# Patient Record
Sex: Female | Born: 1960 | Race: Black or African American | Hispanic: No | Smoking: Former smoker
Health system: Southern US, Community
[De-identification: ages and names within clinical notes are randomized; demographics above are authoritative.]

## PROBLEM LIST (undated history)

## (undated) DIAGNOSIS — E039 Hypothyroidism, unspecified: Secondary | ICD-10-CM

## (undated) DIAGNOSIS — M1990 Unspecified osteoarthritis, unspecified site: Secondary | ICD-10-CM

## (undated) DIAGNOSIS — E7800 Pure hypercholesterolemia, unspecified: Secondary | ICD-10-CM

## (undated) DIAGNOSIS — E119 Type 2 diabetes mellitus without complications: Secondary | ICD-10-CM

## (undated) DIAGNOSIS — I10 Essential (primary) hypertension: Secondary | ICD-10-CM

## (undated) HISTORY — PX: JOINT REPLACEMENT: SHX530

---

## 2017-06-08 ENCOUNTER — Other Ambulatory Visit: Payer: Self-pay | Admitting: Orthopedic Surgery

## 2017-07-23 ENCOUNTER — Inpatient Hospital Stay: Admit: 2017-07-23 | Payer: Self-pay | Admitting: Orthopedic Surgery

## 2017-07-23 SURGERY — ARTHROPLASTY, KNEE, TOTAL
Anesthesia: Spinal | Laterality: Left

## 2017-09-24 ENCOUNTER — Other Ambulatory Visit: Payer: Self-pay | Admitting: Orthopedic Surgery

## 2017-10-08 ENCOUNTER — Other Ambulatory Visit: Payer: Self-pay | Admitting: Orthopedic Surgery

## 2017-10-16 ENCOUNTER — Encounter (HOSPITAL_COMMUNITY): Payer: Self-pay

## 2017-10-16 ENCOUNTER — Encounter (HOSPITAL_COMMUNITY)
Admission: RE | Admit: 2017-10-16 | Discharge: 2017-10-16 | Disposition: A | Discharge status: Home / Self Care | Payer: Medicare HMO | Source: Ambulatory Visit | Attending: Orthopedic Surgery | Admitting: Orthopedic Surgery

## 2017-10-16 ENCOUNTER — Ambulatory Visit (HOSPITAL_COMMUNITY)
Admission: RE | Admit: 2017-10-16 | Discharge: 2017-10-16 | Disposition: A | Discharge status: Home / Self Care | Payer: Medicare HMO | Source: Ambulatory Visit | Attending: Orthopedic Surgery | Admitting: Orthopedic Surgery

## 2017-10-16 ENCOUNTER — Other Ambulatory Visit (HOSPITAL_COMMUNITY): Payer: Self-pay | Admitting: *Deleted

## 2017-10-16 ENCOUNTER — Other Ambulatory Visit: Payer: Self-pay

## 2017-10-16 DIAGNOSIS — E119 Type 2 diabetes mellitus without complications: Secondary | ICD-10-CM | POA: Insufficient documentation

## 2017-10-16 DIAGNOSIS — Z01818 Encounter for other preprocedural examination: Secondary | ICD-10-CM | POA: Diagnosis present

## 2017-10-16 HISTORY — DX: Hypothyroidism, unspecified: E03.9

## 2017-10-16 HISTORY — DX: Essential (primary) hypertension: I10

## 2017-10-16 HISTORY — DX: Unspecified osteoarthritis, unspecified site: M19.90

## 2017-10-16 LAB — SURGICAL PCR SCREEN
MRSA, PCR: NEGATIVE
Staphylococcus aureus: NEGATIVE

## 2017-10-16 LAB — URINALYSIS, ROUTINE W REFLEX MICROSCOPIC
BILIRUBIN URINE: NEGATIVE
Glucose, UA: NEGATIVE mg/dL
Hgb urine dipstick: NEGATIVE
KETONES UR: NEGATIVE mg/dL
Leukocytes, UA: NEGATIVE
NITRITE: NEGATIVE
Protein, ur: NEGATIVE mg/dL
Specific Gravity, Urine: 1.029 (ref 1.005–1.030)
pH: 5 (ref 5.0–8.0)

## 2017-10-16 LAB — PROTIME-INR
INR: 0.94
PROTHROMBIN TIME: 12.4 s (ref 11.4–15.2)

## 2017-10-16 LAB — BASIC METABOLIC PANEL
Anion gap: 10 (ref 5–15)
BUN: 22 mg/dL — AB (ref 6–20)
CO2: 24 mmol/L (ref 22–32)
CREATININE: 1.07 mg/dL — AB (ref 0.44–1.00)
Calcium: 9.9 mg/dL (ref 8.9–10.3)
Chloride: 108 mmol/L (ref 101–111)
GFR calc Af Amer: >60 mL/min (ref >60)
GFR, EST NON AFRICAN AMERICAN: 57 mL/min — AB (ref >60)
Glucose, Bld: 90 mg/dL (ref 65–99)
POTASSIUM: 3.7 mmol/L (ref 3.5–5.1)
SODIUM: 142 mmol/L (ref 135–145)

## 2017-10-16 LAB — CBC WITH DIFFERENTIAL/PLATELET
BASOS ABS: 0 10*3/uL (ref 0.0–0.1)
BASOS PCT: 0 %
EOS ABS: 0.2 10*3/uL (ref 0.0–0.7)
EOS PCT: 3 %
HCT: 38.4 % (ref 36.0–46.0)
Hemoglobin: 12.3 g/dL (ref 12.0–15.0)
LYMPHS PCT: 37 %
Lymphs Abs: 2.6 10*3/uL (ref 0.7–4.0)
MCH: 28.3 pg (ref 26.0–34.0)
MCHC: 32 g/dL (ref 30.0–36.0)
MCV: 88.3 fL (ref 78.0–100.0)
Monocytes Absolute: 0.5 10*3/uL (ref 0.1–1.0)
Monocytes Relative: 7 %
Neutro Abs: 3.7 10*3/uL (ref 1.7–7.7)
Neutrophils Relative %: 53 %
PLATELETS: 317 10*3/uL (ref 150–400)
RBC: 4.35 MIL/uL (ref 3.87–5.11)
RDW: 15.1 % (ref 11.5–15.5)
WBC: 7 10*3/uL (ref 4.0–10.5)

## 2017-10-16 LAB — HEMOGLOBIN A1C
HEMOGLOBIN A1C: 6.7 % — AB (ref 4.8–5.6)
MEAN PLASMA GLUCOSE: 145.59 mg/dL

## 2017-10-16 LAB — TYPE AND SCREEN
ABO/RH(D): AB POS
Antibody Screen: NEGATIVE

## 2017-10-16 LAB — GLUCOSE, CAPILLARY: Glucose-Capillary: 122 mg/dL — ABNORMAL HIGH (ref 65–99)

## 2017-10-16 LAB — APTT: APTT: 28 s (ref 24–36)

## 2017-10-16 NOTE — Pre-Procedure Instructions (Signed)
Nichole Schwartz  10/16/2017    Your procedure is scheduled on Monday, October 22, 2017 at 9:00 AM.   [facility] En[facility] "A" Admitting Office at 7:00 AM.   Call this number if you have problems the morning of surgery: [phone]   Questions prior to day of surgery, please call [phone] between 8 & 4 PM.   Remember:  Do not eat food or drink liquids after midnight Sunday, 10/21/17.  Take these medicines the morning of surgery with A SIP OF WATER: Levothyroxine (Synthroid) Do not take Metformin or Glimepiride morning of surgery.  Stop Fish Oil, NSAIDS (Meloxicam, Ibuprofen, Aleve, etc) and Herbal Medications as of today. Do not use Aspirin products prior to surgery.   How to Manage Your Diabetes Before Surgery   Why is it important to control my blood sugar before and after surgery?   Improving blood sugar levels before and after surgery helps healing and can limit problems.  A way of improving blood sugar control is eating a healthy diet by:  - Eating less sugar and carbohydrates  - Increasing activity/exercise  - Talk with your doctor about reaching your blood sugar goals  High blood sugars (greater than 180 mg/dL) can raise your risk of infections and slow down your recovery so you will need to focus on controlling your diabetes during the weeks before surgery.  Make sure that the doctor who takes care of your diabetes knows about your planned surgery including the date and location.  How do I manage my blood sugars before surgery?   Check your blood sugar at least 4 times a day, 2 days before surgery to make sure that they are not too high or low.  Check your blood sugar the morning of your surgery when you wake up and every 2 hours until you get to the Short-Stay unit.  Treat a low blood sugar (less than 70 mg/dL) with 1/2 cup of clear juice (cranberry or apple), 4 glucose tablets, OR glucose gel.  Recheck blood sugar in 15 minutes after  treatment (to make sure it is greater than 70 mg/dL).  If blood sugar is not greater than 70 mg/dL on re-check, call [phone] for further instructions.   Report your blood sugar to the Short-Stay nurse when you get to Short-Stay.  References:  [facility], 2007 "How to Manage your Diabetes Before and After Surgery".   Do not wear jewelry, make-up or nail polish.  Do not wear lotions, powders, perfumes or deodorant.  Do not shave 48 hours prior to surgery.    Do not bring valuables to the hospital.  [facility] is not responsible for any belongings or valuables.  Contacts, dentures or bridgework may not be worn into surgery.  Leave your suitcase in the car.  After surgery it may be brought to your room.  For patients admitted to the hospital, discharge time will be determined by your treatment team.  [facility] - Pre[facility]  Before surgery, you can play an important role.  Because skin is not sterile, your skin needs to be as free of germs as possible.  You can reduce the number of germs on you skin by washing with CHG (chlorahexidine gluconate) soap before surgery.  CHG is an antiseptic cleaner which kills germs and bonds with the skin to continue killing germs even after washing.  Please DO NOT use if you have an allergy to CHG or antibacterial soaps.  If  your skin becomes reddened/irritated stop using the CHG and inform your nurse when you arrive at Short Stay.  Do not shave (including legs and underarms) for at least 48 hours prior to the first CHG shower.  You may shave your face.  Please follow these instructions carefully:   1.  Shower with CHG Soap the night before surgery and the                    morning of Surgery.  2.  If you choose to wash your hair, wash your hair first as usual with your       normal shampoo.  3.  After you shampoo, rinse your hair and body thoroughly to remove the shampoo.  4.  Use CHG as you would any other  liquid soap.  You can apply chg directly       to the skin and wash gently with scrungie or a clean washcloth.  5.  Apply the CHG Soap to your body ONLY FROM THE NECK DOWN.        Do not use on open wounds or open sores.  Avoid contact with your eyes, ears, mouth and genitals (private parts).  Wash genitals (private parts) with your normal soap.  6.  Wash thoroughly, paying special attention to the area where your surgery        will be performed.  7.  Thoroughly rinse your body with warm water from the neck down.  8.  DO NOT shower/wash with your normal soap after using and rinsing off       the CHG Soap.  9.  Pat yourself dry with a clean towel.            10.  Wear clean pajamas.            11.  Place clean sheets on your bed the night of your first shower and do not        sleep with pets.  Day of Surgery  Shower as above. Do not apply any lotions/deodorants the morning of surgery.  Please wear clean clothes to the hospital.   Please read over the fact sheets that you were given.

## 2017-10-16 NOTE — Progress Notes (Signed)
PCP: Dr. Renaldo Harrisonaniel Addis @ [facility] in [place]  Cardiologist-none  Fasting sugars 86-144

## 2017-10-17 LAB — ABO/RH: ABO/RH(D): AB POS

## 2017-10-19 DIAGNOSIS — M1712 Unilateral primary osteoarthritis, left knee: Secondary | ICD-10-CM | POA: Diagnosis present

## 2017-10-19 MED ORDER — LACTATED RINGERS IV SOLN: INTRAVENOUS | Status: DC

## 2017-10-19 MED ORDER — TRANEXAMIC ACID 1000 MG/10ML IV SOLN
2000.0000 mg | INTRAVENOUS | Status: AC
  Administered 2017-10-22: 2000 mg via TOPICAL
  Filled 2017-10-19: qty 20

## 2017-10-19 MED ORDER — CEFAZOLIN SODIUM-DEXTROSE 2-4 GM/100ML-% IV SOLN
2.0000 g | INTRAVENOUS | Status: AC
  Administered 2017-10-22: 2 g via INTRAVENOUS
  Filled 2017-10-19: qty 100

## 2017-10-19 NOTE — H&P (Signed)
TOTAL KNEE ADMISSION H&P  Patient is being admitted for left total knee arthroplasty.  Subjective:  Chief Complaint:left knee pain.  HPI: Nichole Schwartz, 57 y.o. female, has a history of pain and functional disability in the left knee due to arthritis and has failed non-surgical conservative treatments for greater than 12 weeks to includeNSAID's and/or analgesics, corticosteriod injections, weight reduction as appropriate and activity modification.  Onset of symptoms was gradual, starting several years ago with gradually worsening course since that time. The patient noted no past surgery on the left knee(s).  Patient currently rates pain in the left knee(s) at 10 out of 10 with activity. Patient has night pain, worsening of pain with activity and weight bearing, pain that interferes with activities of daily living, pain with passive range of motion, crepitus and joint swelling.  Patient has evidence of periarticular osteophytes and joint space narrowing by imaging studies.   There is no active infection.  There are no active problems to display for this patient.  Past Medical History:  Diagnosis Date  . Arthritis   . Diabetes mellitus without complication (HCC)   . Hypertension   . Hypothyroidism     No past surgical history on file.  Current Facility-Administered Medications  Medication Dose Route Frequency Provider Last Rate Last Dose  . [START ON 10/22/2017] ceFAZolin (ANCEF) IVPB 2g/100 mL premix  2 g Intravenous To Ladonna SnideSS-Surg Rowan, Frank, MD      . Melene Muller[START ON 10/22/2017] lactated ringers infusion   Intravenous Continuous Gean Birchwoodowan, Frank, MD      . Melene Muller[START ON 10/22/2017] tranexamic acid (CYKLOKAPRON) 2,000 mg in sodium chloride 0.9 % 50 mL Topical Application  2,000 mg Topical To OR Gean Birchwoodowan, Frank, MD       Current Outpatient Medications  Medication Sig Dispense Refill Last Dose  . BIOTIN PO Take 1 tablet by mouth daily.     . ergocalciferol (VITAMIN D2) 50000 units capsule Take 50,000 Units by  mouth every Monday.     Marland Kitchen. glimepiride (AMARYL) 4 MG tablet Take 4 mg by mouth daily with breakfast.     . ibuprofen (ADVIL,MOTRIN) 800 MG tablet Take 800 mg by mouth every 12 (twelve) hours as needed for headache or moderate pain.     Marland Kitchen. levothyroxine (SYNTHROID, LEVOTHROID) 88 MCG tablet Take 88 mcg by mouth daily before breakfast.     . losartan (COZAAR) 50 MG tablet Take 50 mg by mouth daily.     . meloxicam (MOBIC) 15 MG tablet Take 15 mg by mouth daily.     . metFORMIN (GLUCOPHAGE) 500 MG tablet Take 500-1,000 mg by mouth See admin instructions. Take 500 mg by mouth in the morning and take 1000 mg by mouth in the evening     . Omega-3 Fatty Acids (FISH OIL PO) Take 3 capsules by mouth daily.     . simvastatin (ZOCOR) 40 MG tablet Take 40 mg by mouth 3 (three) times a week.      No Known Allergies  Social History   Tobacco Use  . Smoking status: Former Smoker    Last attempt to quit: 01/13/2017    Years since quitting: 0.7  . Smokeless tobacco: Never Used  . Tobacco comment: social  smoker  Substance Use Topics  . Alcohol use: Yes    Comment: social    No family history on file.   Review of Systems  Constitutional: Negative.   HENT: Positive for nosebleeds.   Eyes: Negative.   Respiratory: Negative.  Cardiovascular: Negative.   Gastrointestinal: Positive for nausea.       Poor appetite  Genitourinary:       Kidney stones  Musculoskeletal: Positive for joint pain.  Skin: Positive for rash.  Neurological: Positive for seizures.  Endo/Heme/Allergies: Bruises/bleeds easily.  Psychiatric/Behavioral: Positive for depression. The patient is nervous/anxious.     Objective:  Physical Exam  Constitutional: She is oriented to person, place, and time. She appears well-developed and well-nourished.  HENT:  Head: Normocephalic and atraumatic.  Eyes: Pupils are equal, round, and reactive to light.  Neck: Normal range of motion. Neck supple.  Cardiovascular: Intact distal pulses.   Respiratory: Effort normal.  Musculoskeletal:  Inspection of bilateral knees revealed no obvious deformity or muscle atrophy, no warmth, erythema, ecchymosis, or effusion, she does have tenderness to palpation both medial lateral joint lines of both knees, obvious crepitation is felt along the arc of range of motion, her range of motion today is from 5 to 110 on both sides, no signs of ligamentous instability, she is neurovascularly intact distally, strength 5/5 in bilateral lower extremity's, sensation 2+ bilateral lower extremities  Neurological: She is alert and oriented to person, place, and time.  Skin: Skin is warm and dry.  Psychiatric: She has a normal mood and affect. Her behavior is normal. Judgment and thought content normal.    Vital signs in last 24 hours:    Labs:   Estimated body mass index is 31.72 kg/m as calculated from the following:   Height as of 10/16/17: 5\' 6"  (1.676 m).   Weight as of 10/16/17: 89.1 kg (196 lb 8 oz).   Imaging Review Plain radiographs demonstrate  bilateral knee osteoarthritis medial compartment bone-on-bone left worse than right with periarticular osteophyte formation.  Assessment/Plan:  End stage arthritis, left knee   The patient history, physical examination, clinical judgment of the provider and imaging studies are consistent with end stage degenerative joint disease of the left knee(s) and total knee arthroplasty is deemed medically necessary. The treatment options including medical management, injection therapy arthroscopy and arthroplasty were discussed at length. The risks and benefits of total knee arthroplasty were presented and reviewed. The risks due to aseptic loosening, infection, stiffness, patella tracking problems, thromboembolic complications and other imponderables were discussed. The patient acknowledged the explanation, agreed to proceed with the plan and consent was signed. Patient is being admitted for inpatient treatment for  surgery, pain control, PT, OT, prophylactic antibiotics, VTE prophylaxis, progressive ambulation and ADL's and discharge planning. The patient is planning to be discharged home with home health services

## 2017-10-21 NOTE — Anesthesia Preprocedure Evaluation (Addendum)
Anesthesia Evaluation  Patient identified by MRN, date of birth, ID band Patient awake    Reviewed: Allergy & Precautions, NPO status , Patient's Chart, lab work & pertinent test results  Airway Mallampati: II  TM Distance: >3 FB Neck ROM: Full    Dental no notable dental hx.    Pulmonary neg pulmonary ROS, former smoker,    Pulmonary exam normal breath sounds clear to auscultation       Cardiovascular hypertension, negative cardio ROS Normal cardiovascular exam+ Valvular Problems/Murmurs  Rhythm:Regular Rate:Normal     Neuro/Psych negative neurological ROS  negative psych ROS   GI/Hepatic negative GI ROS, Neg liver ROS,   Endo/Other  negative endocrine ROSdiabetes  Renal/GU negative Renal ROS  negative genitourinary   Musculoskeletal negative musculoskeletal ROS (+)   Abdominal   Peds  Hematology negative hematology ROS (+)   Anesthesia Other Findings   Reproductive/Obstetrics                            Lab Results  Component Value Date   WBC 7.0 10/16/2017   HGB 12.3 10/16/2017   HCT 38.4 10/16/2017   MCV 88.3 10/16/2017   PLT 317 10/16/2017   Lab Results  Component Value Date   CREATININE 1.07 (H) 10/16/2017   BUN 22 (H) 10/16/2017   NA 142 10/16/2017   K 3.7 10/16/2017   CL 108 10/16/2017   CO2 24 10/16/2017    Anesthesia Physical Anesthesia Plan  ASA: III  Anesthesia Plan: Spinal   Post-op Pain Management:    Induction:   PONV Risk Score and Plan: 2 and Treatment may vary due to age or medical condition, Ondansetron and Dexamethasone  Airway Management Planned: Mask, Natural Airway and Nasal Cannula  Additional Equipment:   Intra-op Plan:   Post-operative Plan:   Informed Consent: I have reviewed the patients History and Physical, chart, labs and discussed the procedure including the risks, benefits and alternatives for the proposed anesthesia with the  patient or authorized representative who has indicated his/her understanding and acceptance.   Dental advisory given  Plan Discussed with: CRNA  Anesthesia Plan Comments:         Anesthesia Quick Evaluation

## 2017-10-22 ENCOUNTER — Inpatient Hospital Stay (HOSPITAL_COMMUNITY): Payer: Medicare HMO | Admitting: Anesthesiology

## 2017-10-22 ENCOUNTER — Encounter (HOSPITAL_COMMUNITY): Payer: Self-pay | Admitting: Certified Registered Nurse Anesthetist

## 2017-10-22 ENCOUNTER — Encounter (HOSPITAL_COMMUNITY)
Admission: RE | Disposition: A | Discharge status: Home Health | Payer: Self-pay | Source: Ambulatory Visit | Attending: Orthopedic Surgery

## 2017-10-22 ENCOUNTER — Inpatient Hospital Stay (HOSPITAL_COMMUNITY)
Admission: RE | Admit: 2017-10-22 | Discharge: 2017-10-24 | DRG: 470 | Disposition: A | Discharge status: Home Health | Payer: Medicare HMO | Source: Ambulatory Visit | Attending: Orthopedic Surgery | Admitting: Orthopedic Surgery

## 2017-10-22 DIAGNOSIS — I10 Essential (primary) hypertension: Secondary | ICD-10-CM | POA: Diagnosis present

## 2017-10-22 DIAGNOSIS — Z87891 Personal history of nicotine dependence: Secondary | ICD-10-CM

## 2017-10-22 DIAGNOSIS — E039 Hypothyroidism, unspecified: Secondary | ICD-10-CM | POA: Diagnosis present

## 2017-10-22 DIAGNOSIS — Z7984 Long term (current) use of oral hypoglycemic drugs: Secondary | ICD-10-CM

## 2017-10-22 DIAGNOSIS — Z79890 Hormone replacement therapy (postmenopausal): Secondary | ICD-10-CM | POA: Diagnosis not present

## 2017-10-22 DIAGNOSIS — Z23 Encounter for immunization: Secondary | ICD-10-CM | POA: Diagnosis present

## 2017-10-22 DIAGNOSIS — M1712 Unilateral primary osteoarthritis, left knee: Secondary | ICD-10-CM | POA: Diagnosis present

## 2017-10-22 DIAGNOSIS — Z79899 Other long term (current) drug therapy: Secondary | ICD-10-CM | POA: Diagnosis not present

## 2017-10-22 DIAGNOSIS — E119 Type 2 diabetes mellitus without complications: Secondary | ICD-10-CM | POA: Diagnosis present

## 2017-10-22 DIAGNOSIS — D62 Acute posthemorrhagic anemia: Secondary | ICD-10-CM | POA: Diagnosis not present

## 2017-10-22 HISTORY — PX: TOTAL KNEE ARTHROPLASTY: SHX125

## 2017-10-22 LAB — GLUCOSE, CAPILLARY
Glucose-Capillary: 159 mg/dL — ABNORMAL HIGH (ref 65–99)
Glucose-Capillary: 129 mg/dL — ABNORMAL HIGH (ref 65–99)

## 2017-10-22 SURGERY — ARTHROPLASTY, KNEE, TOTAL
Anesthesia: Spinal | Site: Knee | Laterality: Left

## 2017-10-22 MED ORDER — EPHEDRINE 5 MG/ML INJ
INTRAVENOUS | Status: AC
  Filled 2017-10-22: qty 10

## 2017-10-22 MED ORDER — PHENOL 1.4 % MT LIQD: 1.0000 | OROMUCOSAL | Status: DC | PRN

## 2017-10-22 MED ORDER — LACTATED RINGERS IV SOLN
INTRAVENOUS | Status: DC
Start: 2017-10-22 — End: 2017-10-22
  Administered 2017-10-22: 50 mL/h via INTRAVENOUS

## 2017-10-22 MED ORDER — BUPIVACAINE-EPINEPHRINE 0.25% -1:200000 IJ SOLN
INTRAMUSCULAR | Status: AC
  Filled 2017-10-22: qty 1

## 2017-10-22 MED ORDER — MIDAZOLAM HCL 5 MG/5ML IJ SOLN
INTRAMUSCULAR | Status: DC | PRN
  Administered 2017-10-22 (×2): 1 mg via INTRAVENOUS

## 2017-10-22 MED ORDER — ROPIVACAINE HCL 7.5 MG/ML IJ SOLN
INTRAMUSCULAR | Status: DC | PRN
  Administered 2017-10-22: 20 mL via PERINEURAL

## 2017-10-22 MED ORDER — 0.9 % SODIUM CHLORIDE (POUR BTL) OPTIME
TOPICAL | Status: DC | PRN
Start: 2017-10-22 — End: 2017-10-22
  Administered 2017-10-22: 1000 mL

## 2017-10-22 MED ORDER — FENTANYL CITRATE (PF) 250 MCG/5ML IJ SOLN
INTRAMUSCULAR | Status: AC
  Filled 2017-10-22: qty 5

## 2017-10-22 MED ORDER — DEXAMETHASONE SODIUM PHOSPHATE 10 MG/ML IJ SOLN
INTRAMUSCULAR | Status: DC | PRN
Start: 2017-10-22 — End: 2017-10-22
  Administered 2017-10-22: 5 mg via INTRAVENOUS

## 2017-10-22 MED ORDER — PROPOFOL 10 MG/ML IV BOLUS
INTRAVENOUS | Status: DC | PRN
  Administered 2017-10-22: 20 mg via INTRAVENOUS

## 2017-10-22 MED ORDER — MIDAZOLAM HCL 2 MG/2ML IJ SOLN
INTRAMUSCULAR | Status: AC
  Filled 2017-10-22: qty 2

## 2017-10-22 MED ORDER — MENTHOL 3 MG MT LOZG: 1.0000 | LOZENGE | OROMUCOSAL | Status: DC | PRN

## 2017-10-22 MED ORDER — ACETAMINOPHEN 650 MG RE SUPP: 650.0000 mg | RECTAL | Status: DC | PRN

## 2017-10-22 MED ORDER — BUPIVACAINE LIPOSOME 1.3 % IJ SUSP
20.0000 mL | INTRAMUSCULAR | Status: AC
  Administered 2017-10-22: 20 mL
  Filled 2017-10-22: qty 20

## 2017-10-22 MED ORDER — PHENYLEPHRINE 40 MCG/ML (10ML) SYRINGE FOR IV PUSH (FOR BLOOD PRESSURE SUPPORT)
PREFILLED_SYRINGE | INTRAVENOUS | Status: AC
  Filled 2017-10-22: qty 10

## 2017-10-22 MED ORDER — GLIMEPIRIDE 4 MG PO TABS
4.0000 mg | ORAL_TABLET | Freq: Every day | ORAL | Status: DC
  Administered 2017-10-23 – 2017-10-24 (×2): 4 mg via ORAL
  Filled 2017-10-22 (×2): qty 1

## 2017-10-22 MED ORDER — SODIUM CHLORIDE 0.9 % IJ SOLN
INTRAMUSCULAR | Status: DC | PRN
  Administered 2017-10-22: 50 mL

## 2017-10-22 MED ORDER — OXYCODONE-ACETAMINOPHEN 5-325 MG PO TABS: 1.0000 | ORAL_TABLET | ORAL | 0 refills | Status: DC | PRN

## 2017-10-22 MED ORDER — BISACODYL 5 MG PO TBEC: 5.0000 mg | DELAYED_RELEASE_TABLET | Freq: Every day | ORAL | Status: DC | PRN

## 2017-10-22 MED ORDER — TIZANIDINE HCL 2 MG PO TABS: 2.0000 mg | ORAL_TABLET | Freq: Four times a day (QID) | ORAL | 0 refills | Status: DC | PRN

## 2017-10-22 MED ORDER — OXYCODONE HCL 5 MG PO TABS
5.0000 mg | ORAL_TABLET | ORAL | Status: DC | PRN
  Administered 2017-10-24: 5 mg via ORAL
  Filled 2017-10-22: qty 1

## 2017-10-22 MED ORDER — OXYCODONE HCL 5 MG PO TABS
10.0000 mg | ORAL_TABLET | ORAL | Status: DC | PRN
  Administered 2017-10-22 – 2017-10-24 (×8): 10 mg via ORAL
  Filled 2017-10-22 (×8): qty 2

## 2017-10-22 MED ORDER — METOCLOPRAMIDE HCL 5 MG/ML IJ SOLN: 5.0000 mg | Freq: Three times a day (TID) | INTRAMUSCULAR | Status: DC | PRN

## 2017-10-22 MED ORDER — BUPIVACAINE-EPINEPHRINE (PF) 0.25% -1:200000 IJ SOLN
INTRAMUSCULAR | Status: DC | PRN
  Administered 2017-10-22: 50 mL

## 2017-10-22 MED ORDER — FENTANYL CITRATE (PF) 100 MCG/2ML IJ SOLN
INTRAMUSCULAR | Status: AC
  Administered 2017-10-22: 50 ug
  Filled 2017-10-22: qty 2

## 2017-10-22 MED ORDER — FENTANYL CITRATE (PF) 100 MCG/2ML IJ SOLN
INTRAMUSCULAR | Status: DC | PRN
  Administered 2017-10-22: 50 ug via INTRAVENOUS

## 2017-10-22 MED ORDER — TRANEXAMIC ACID 1000 MG/10ML IV SOLN
1000.0000 mg | Freq: Once | INTRAVENOUS | Status: AC
  Administered 2017-10-22: 1000 mg via INTRAVENOUS
  Filled 2017-10-22: qty 10

## 2017-10-22 MED ORDER — ALUM & MAG HYDROXIDE-SIMETH 200-200-20 MG/5ML PO SUSP: 30.0000 mL | ORAL | Status: DC | PRN

## 2017-10-22 MED ORDER — PHENYLEPHRINE HCL 10 MG/ML IJ SOLN
INTRAMUSCULAR | Status: DC | PRN
  Administered 2017-10-22: 20 ug/min via INTRAVENOUS

## 2017-10-22 MED ORDER — DEXAMETHASONE SODIUM PHOSPHATE 10 MG/ML IJ SOLN
INTRAMUSCULAR | Status: AC
  Filled 2017-10-22: qty 1

## 2017-10-22 MED ORDER — DEXAMETHASONE SODIUM PHOSPHATE 10 MG/ML IJ SOLN
10.0000 mg | Freq: Once | INTRAMUSCULAR | Status: AC
  Administered 2017-10-23: 10 mg via INTRAVENOUS
  Filled 2017-10-22: qty 1

## 2017-10-22 MED ORDER — ASPIRIN EC 325 MG PO TBEC
325.0000 mg | DELAYED_RELEASE_TABLET | Freq: Every day | ORAL | Status: DC
  Administered 2017-10-23 – 2017-10-24 (×2): 325 mg via ORAL
  Filled 2017-10-22 (×2): qty 1

## 2017-10-22 MED ORDER — METHOCARBAMOL 500 MG PO TABS
500.0000 mg | ORAL_TABLET | Freq: Four times a day (QID) | ORAL | Status: DC | PRN
  Administered 2017-10-22 – 2017-10-24 (×7): 500 mg via ORAL
  Filled 2017-10-22 (×7): qty 1

## 2017-10-22 MED ORDER — METOCLOPRAMIDE HCL 5 MG PO TABS: 5.0000 mg | ORAL_TABLET | Freq: Three times a day (TID) | ORAL | Status: DC | PRN

## 2017-10-22 MED ORDER — SENNOSIDES-DOCUSATE SODIUM 8.6-50 MG PO TABS: 1.0000 | ORAL_TABLET | Freq: Every evening | ORAL | Status: DC | PRN

## 2017-10-22 MED ORDER — LEVOTHYROXINE SODIUM 88 MCG PO TABS
88.0000 ug | ORAL_TABLET | Freq: Every day | ORAL | Status: DC
  Administered 2017-10-23 – 2017-10-24 (×2): 88 ug via ORAL
  Filled 2017-10-22 (×2): qty 1

## 2017-10-22 MED ORDER — CELECOXIB 200 MG PO CAPS
200.0000 mg | ORAL_CAPSULE | Freq: Two times a day (BID) | ORAL | Status: DC
  Administered 2017-10-22 – 2017-10-24 (×5): 200 mg via ORAL
  Filled 2017-10-22 (×5): qty 1

## 2017-10-22 MED ORDER — VITAMIN D (ERGOCALCIFEROL) 1.25 MG (50000 UNIT) PO CAPS
50000.0000 [IU] | ORAL_CAPSULE | ORAL | Status: DC
  Administered 2017-10-22: 50000 [IU] via ORAL
  Filled 2017-10-22: qty 1

## 2017-10-22 MED ORDER — LOSARTAN POTASSIUM 50 MG PO TABS
50.0000 mg | ORAL_TABLET | Freq: Every day | ORAL | Status: DC
  Administered 2017-10-22 – 2017-10-24 (×3): 50 mg via ORAL
  Filled 2017-10-22 (×3): qty 1

## 2017-10-22 MED ORDER — GABAPENTIN 300 MG PO CAPS
300.0000 mg | ORAL_CAPSULE | Freq: Three times a day (TID) | ORAL | Status: DC
  Administered 2017-10-22 – 2017-10-24 (×6): 300 mg via ORAL
  Filled 2017-10-22 (×6): qty 1

## 2017-10-22 MED ORDER — PROPOFOL 500 MG/50ML IV EMUL
INTRAVENOUS | Status: DC | PRN
  Administered 2017-10-22: 50 ug/kg/min via INTRAVENOUS

## 2017-10-22 MED ORDER — PROPOFOL 10 MG/ML IV BOLUS
INTRAVENOUS | Status: AC
  Filled 2017-10-22: qty 20

## 2017-10-22 MED ORDER — ONDANSETRON HCL 4 MG/2ML IJ SOLN
4.0000 mg | Freq: Four times a day (QID) | INTRAMUSCULAR | Status: DC | PRN
  Administered 2017-10-23: 4 mg via INTRAVENOUS
  Filled 2017-10-22: qty 2

## 2017-10-22 MED ORDER — METFORMIN HCL 500 MG PO TABS
500.0000 mg | ORAL_TABLET | Freq: Every day | ORAL | Status: DC
  Administered 2017-10-23 – 2017-10-24 (×2): 500 mg via ORAL
  Filled 2017-10-22 (×2): qty 1

## 2017-10-22 MED ORDER — HYDROMORPHONE HCL 1 MG/ML IJ SOLN: 0.5000 mg | INTRAMUSCULAR | Status: DC | PRN

## 2017-10-22 MED ORDER — ASPIRIN EC 325 MG PO TBEC: 325.0000 mg | DELAYED_RELEASE_TABLET | Freq: Two times a day (BID) | ORAL | 0 refills | Status: DC

## 2017-10-22 MED ORDER — SODIUM CHLORIDE 0.9 % IR SOLN
Status: DC | PRN
  Administered 2017-10-22: 3000 mL

## 2017-10-22 MED ORDER — TRANEXAMIC ACID 1000 MG/10ML IV SOLN
1000.0000 mg | INTRAVENOUS | Status: AC
  Administered 2017-10-22: 1000 mg via INTRAVENOUS
  Filled 2017-10-22: qty 1100

## 2017-10-22 MED ORDER — ACETAMINOPHEN 325 MG PO TABS
650.0000 mg | ORAL_TABLET | ORAL | Status: DC | PRN
  Administered 2017-10-22 – 2017-10-23 (×2): 650 mg via ORAL
  Filled 2017-10-22 (×2): qty 2

## 2017-10-22 MED ORDER — MIDAZOLAM HCL 2 MG/2ML IJ SOLN
INTRAMUSCULAR | Status: AC
  Administered 2017-10-22: 2 mg
  Filled 2017-10-22: qty 2

## 2017-10-22 MED ORDER — ROCURONIUM BROMIDE 10 MG/ML (PF) SYRINGE
PREFILLED_SYRINGE | INTRAVENOUS | Status: AC
  Filled 2017-10-22: qty 5

## 2017-10-22 MED ORDER — SUCCINYLCHOLINE CHLORIDE 200 MG/10ML IV SOSY
PREFILLED_SYRINGE | INTRAVENOUS | Status: AC
  Filled 2017-10-22: qty 10

## 2017-10-22 MED ORDER — DOCUSATE SODIUM 100 MG PO CAPS
100.0000 mg | ORAL_CAPSULE | Freq: Two times a day (BID) | ORAL | Status: DC
  Administered 2017-10-22 – 2017-10-24 (×5): 100 mg via ORAL
  Filled 2017-10-22 (×5): qty 1

## 2017-10-22 MED ORDER — LIDOCAINE 2% (20 MG/ML) 5 ML SYRINGE
INTRAMUSCULAR | Status: AC
  Filled 2017-10-22: qty 5

## 2017-10-22 MED ORDER — ONDANSETRON HCL 4 MG/2ML IJ SOLN
INTRAMUSCULAR | Status: AC
  Filled 2017-10-22: qty 2

## 2017-10-22 MED ORDER — METFORMIN HCL 500 MG PO TABS
1000.0000 mg | ORAL_TABLET | Freq: Every day | ORAL | Status: DC
  Administered 2017-10-22 – 2017-10-23 (×2): 1000 mg via ORAL
  Filled 2017-10-22 (×2): qty 2

## 2017-10-22 MED ORDER — SIMVASTATIN 40 MG PO TABS
40.0000 mg | ORAL_TABLET | ORAL | Status: DC
  Administered 2017-10-22: 40 mg via ORAL
  Filled 2017-10-22: qty 1

## 2017-10-22 MED ORDER — FLEET ENEMA 7-19 GM/118ML RE ENEM: 1.0000 | ENEMA | Freq: Once | RECTAL | Status: DC | PRN

## 2017-10-22 MED ORDER — KCL IN DEXTROSE-NACL 20-5-0.45 MEQ/L-%-% IV SOLN
INTRAVENOUS | Status: DC
  Administered 2017-10-22 (×2): via INTRAVENOUS
  Filled 2017-10-22 (×2): qty 1000

## 2017-10-22 MED ORDER — DIPHENHYDRAMINE HCL 12.5 MG/5ML PO ELIX: 12.5000 mg | ORAL_SOLUTION | ORAL | Status: DC | PRN

## 2017-10-22 MED ORDER — METHOCARBAMOL 1000 MG/10ML IJ SOLN
500.0000 mg | Freq: Four times a day (QID) | INTRAVENOUS | Status: DC | PRN
  Filled 2017-10-22: qty 5

## 2017-10-22 MED ORDER — ONDANSETRON HCL 4 MG/2ML IJ SOLN
INTRAMUSCULAR | Status: DC | PRN
  Administered 2017-10-22: 4 mg via INTRAVENOUS

## 2017-10-22 MED ORDER — ONDANSETRON HCL 4 MG PO TABS: 4.0000 mg | ORAL_TABLET | Freq: Four times a day (QID) | ORAL | Status: DC | PRN

## 2017-10-22 SURGICAL SUPPLY — 52 items
BANDAGE ESMARK 6X9 LF (GAUZE/BANDAGES/DRESSINGS) ×1 IMPLANT
BLADE SAG 18X100X1.27 (BLADE) ×3 IMPLANT
BLADE SAGITTAL 13X1.27X60 (BLADE) IMPLANT
BLADE SAGITTAL 13X1.27X60MM (BLADE)
BLADE SAW SGTL 13X75X1.27 (BLADE) ×3 IMPLANT
BNDG ELASTIC 6X10 VLCR STRL LF (GAUZE/BANDAGES/DRESSINGS) ×3 IMPLANT
BNDG ELASTIC 6X15 VLCR STRL LF (GAUZE/BANDAGES/DRESSINGS) ×3 IMPLANT
BNDG ESMARK 6X9 LF (GAUZE/BANDAGES/DRESSINGS) ×3
BOWL SMART MIX CTS (DISPOSABLE) ×3 IMPLANT
CAPT KNEE TOTAL 3 ATTUNE ×3 IMPLANT
CEMENT HV SMART SET (Cement) ×6 IMPLANT
COVER SURGICAL LIGHT HANDLE (MISCELLANEOUS) ×3 IMPLANT
CUFF TOURNIQUET SINGLE 34IN LL (TOURNIQUET CUFF) ×3 IMPLANT
CUFF TOURNIQUET SINGLE 44IN (TOURNIQUET CUFF) IMPLANT
DRAPE EXTREMITY T 121X128X90 (DRAPE) ×3 IMPLANT
DRAPE U-SHAPE 47X51 STRL (DRAPES) ×3 IMPLANT
DRSG AQUACEL AG ADV 3.5X10 (GAUZE/BANDAGES/DRESSINGS) ×3 IMPLANT
DURAPREP 26ML APPLICATOR (WOUND CARE) ×3 IMPLANT
ELECT REM PT RETURN 9FT ADLT (ELECTROSURGICAL) ×3
ELECTRODE REM PT RTRN 9FT ADLT (ELECTROSURGICAL) ×1 IMPLANT
GLOVE BIO SURGEON STRL SZ7.5 (GLOVE) ×3 IMPLANT
GLOVE BIO SURGEON STRL SZ8.5 (GLOVE) ×3 IMPLANT
GLOVE BIOGEL PI IND STRL 8 (GLOVE) ×1 IMPLANT
GLOVE BIOGEL PI IND STRL 9 (GLOVE) ×1 IMPLANT
GLOVE BIOGEL PI INDICATOR 8 (GLOVE) ×2
GLOVE BIOGEL PI INDICATOR 9 (GLOVE) ×2
GOWN STRL REUS W/ TWL LRG LVL3 (GOWN DISPOSABLE) ×1 IMPLANT
GOWN STRL REUS W/ TWL XL LVL3 (GOWN DISPOSABLE) ×2 IMPLANT
GOWN STRL REUS W/TWL LRG LVL3 (GOWN DISPOSABLE) ×2
GOWN STRL REUS W/TWL XL LVL3 (GOWN DISPOSABLE) ×4
HANDPIECE INTERPULSE COAX TIP (DISPOSABLE) ×2
HOOD PEEL AWAY FACE SHEILD DIS (HOOD) ×6 IMPLANT
KIT BASIN OR (CUSTOM PROCEDURE TRAY) ×3 IMPLANT
KIT ROOM TURNOVER OR (KITS) ×3 IMPLANT
MANIFOLD NEPTUNE II (INSTRUMENTS) ×3 IMPLANT
NEEDLE 22X1 1/2 (OR ONLY) (NEEDLE) ×6 IMPLANT
NS IRRIG 1000ML POUR BTL (IV SOLUTION) ×3 IMPLANT
PACK TOTAL JOINT (CUSTOM PROCEDURE TRAY) ×3 IMPLANT
PAD ARMBOARD 7.5X6 YLW CONV (MISCELLANEOUS) ×6 IMPLANT
SET HNDPC FAN SPRY TIP SCT (DISPOSABLE) ×1 IMPLANT
SUT VIC AB 0 CT1 27 (SUTURE) ×2
SUT VIC AB 0 CT1 27XBRD ANBCTR (SUTURE) ×1 IMPLANT
SUT VIC AB 1 CTX 36 (SUTURE) ×2
SUT VIC AB 1 CTX36XBRD ANBCTR (SUTURE) ×1 IMPLANT
SUT VIC AB 2-0 CT1 27 (SUTURE) ×2
SUT VIC AB 2-0 CT1 TAPERPNT 27 (SUTURE) ×1 IMPLANT
SUT VIC AB 3-0 CT1 27 (SUTURE) ×2
SUT VIC AB 3-0 CT1 TAPERPNT 27 (SUTURE) ×1 IMPLANT
SYR CONTROL 10ML LL (SYRINGE) ×6 IMPLANT
TOWEL OR 17X24 6PK STRL BLUE (TOWEL DISPOSABLE) ×3 IMPLANT
TOWEL OR 17X26 10 PK STRL BLUE (TOWEL DISPOSABLE) ×3 IMPLANT
TRAY CATH 16FR W/PLASTIC CATH (SET/KITS/TRAYS/PACK) ×3 IMPLANT

## 2017-10-22 NOTE — Progress Notes (Signed)
Orthopedic Tech Progress Note Patient Details:  Tanya Nonesamela Thornton 04/21/1961 [id number]  Ortho Devices Type of Ortho Device: Bone foam zero knee Ortho Device/Splint Location: lle Ortho Device/Splint Interventions: Application   Post Interventions Patient Tolerated: Well Instructions Provided: Care of device   Nikki DomCrawford, Samamtha Tiegs 10/22/2017, 10:59 AM

## 2017-10-22 NOTE — Anesthesia Procedure Notes (Signed)
Anesthesia Regional Block: Adductor canal block   Pre-Anesthetic Checklist: ,, timeout performed, Correct Patient, Correct Site, Correct Laterality, Correct Procedure, Correct Position, site marked, Risks and benefits discussed,  Surgical consent,  Pre-op evaluation,  At surgeon's request and post-op pain management  Laterality: Left  Prep: chloraprep       Needles:  Injection technique: Single-shot  Needle Type: Echogenic Needle     Needle Length: 9cm  Needle Gauge: 22     Additional Needles:   Procedures:,,,, ultrasound used (permanent image in chart),,,,  Narrative:  Start time: 10/22/2017 8:37 AM End time: 10/22/2017 8:46 AM Injection made incrementally with aspirations every 5 mL.  Performed by: Personally  Anesthesiologist: Trevor IhaHouser, Stephen A, MD

## 2017-10-22 NOTE — Evaluation (Signed)
Physical Therapy Evaluation Patient Details Name: Nichole Schwartz MRN: [id number] DOB: 04/03/1961 Today's Date: 10/22/2017   History of Present Illness  Pt is a 57 y/o female s/p L TKA. PMH inlucdes DM and HTN.   Clinical Impression  Pt s/p surgery above with deficits below. Mobility limited to chair this session as pt with increased pain. Limited tolerance for HEP as well. Reviewed precautions and POC with pt and family. Will continue to follow acutely to maximize functional mobility independence and safety.     Follow Up Recommendations DC plan and follow up therapy as arranged by surgeon;Supervision for mobility/OOB    Equipment Recommendations  None recommended by PT    Recommendations for Other Services       Precautions / Restrictions Precautions Precautions: Knee Precaution Booklet Issued: Yes (comment) Precaution Comments: REviewed supine ther ex with pt. Limited secondary to pain and stiffness.  Restrictions Weight Bearing Restrictions: Yes LLE Weight Bearing: Weight bearing as tolerated      Mobility  Bed Mobility Overal bed mobility: Needs Assistance Bed Mobility: Supine to Sit     Supine to sit: Min assist     General bed mobility comments: Min A for LLE assist. Required increased time and use of bed rails and elevated HOB.   Transfers Overall transfer level: Needs assistance Equipment used: Rolling walker (2 wheeled) Transfers: Sit to/from UGI CorporationStand;Stand Pivot Transfers Sit to Stand: Mod assist Stand pivot transfers: Min assist       General transfer comment: Mod A for lift assist and steadying to stand. Required verbal cues for safe hand placement. Min A for steadying throughout stand pivot transfer to recliner. Verbal cues for sequencing using RW. Further distance limited secondary to pain.   Ambulation/Gait             General Gait Details: Deferred secondary to pain.   Stairs            Wheelchair Mobility    Modified Rankin (Stroke  Patients Only)       Balance Overall balance assessment: Needs assistance Sitting-balance support: No upper extremity supported;Feet supported Sitting balance-Leahy Scale: Good     Standing balance support: Bilateral upper extremity supported;During functional activity Standing balance-Leahy Scale: Poor Standing balance comment: Reliant on BUE support and external support.                              Pertinent Vitals/Pain Pain Assessment: Faces Faces Pain Scale: Hurts whole lot Pain Location: L knee  Pain Descriptors / Indicators: Aching;Operative site guarding Pain Intervention(s): Limited activity within patient's tolerance;Monitored during session;Repositioned    Home Living Family/patient expects to be discharged to:: Private residence Living Arrangements: Children Available Help at Discharge: Family;Available 24 hours/day Type of Home: House Home Access: Stairs to enter Entrance Stairs-Rails: LawyerLeft;Right Entrance Stairs-Number of Steps: flight  Home Layout: Two level;Able to live on main level with bedroom/bathroom Home Equipment: Dan HumphreysWalker - 2 wheels;Cane - single point;Bedside commode;Shower seat;Hand held shower head      Prior Function Level of Independence: Independent               Hand Dominance        Extremity/Trunk Assessment   Upper Extremity Assessment Upper Extremity Assessment: Defer to OT evaluation    Lower Extremity Assessment Lower Extremity Assessment: LLE deficits/detail LLE Deficits / Details: Slightly decreased sensation. Able to perform ther ex below. Deficits consistent with post op pain and weakness.  Cervical / Trunk Assessment Cervical / Trunk Assessment: Normal  Communication   Communication: No difficulties  Cognition Arousal/Alertness: Awake/alert Behavior During Therapy: WFL for tasks assessed/performed Overall Cognitive Status: Within Functional Limits for tasks assessed                                         General Comments General comments (skin integrity, edema, etc.): Pt's daughter present during session.     Exercises Total Joint Exercises Ankle Circles/Pumps: AROM;Both;20 reps Quad Sets: AROM;Other reps (comment);Left(1 for practice; refused more secondary to pain) Heel Slides: AROM;Left;10 reps(partial range )   Assessment/Plan    PT Assessment Patient needs continued PT services  PT Problem List         PT Treatment Interventions DME instruction;Gait training;Functional mobility training;Stair training;Therapeutic activities;Therapeutic exercise;Balance training;Neuromuscular re-education;Patient/family education    PT Goals (Current goals can be found in the Care Plan section)  Acute Rehab PT Goals Patient Stated Goal: to decrease pain  PT Goal Formulation: With patient Time For Goal Achievement: 10/29/17 Potential to Achieve Goals: Good    Frequency 7X/week   Barriers to discharge        Co-evaluation               AM-PAC PT "6 Clicks" Daily Activity  Outcome Measure Difficulty turning over in bed (including adjusting bedclothes, sheets and blankets)?: A Little Difficulty moving from lying on back to sitting on the side of the bed? : Unable Difficulty sitting down on and standing up from a chair with arms (e.g., wheelchair, bedside commode, etc,.)?: Unable Help needed moving to and from a bed to chair (including a wheelchair)?: A Little Help needed walking in hospital room?: A Lot Help needed climbing 3-5 steps with a railing? : A Lot 6 Click Score: 12    End of Session Equipment Utilized During Treatment: Gait belt Activity Tolerance: Patient limited by pain Patient left: in chair;with call bell/phone within reach;with family/visitor present Nurse Communication: Mobility status PT Visit Diagnosis: Unsteadiness on feet (R26.81);Other abnormalities of gait and mobility (R26.89);Pain Pain - Right/Left: Left Pain - part of body:  Knee    Time: 1610-9604 PT Time Calculation (min) (ACUTE ONLY): 32 min   Charges:   PT Evaluation $PT Eval Low Complexity: 1 Low PT Treatments $Therapeutic Activity: 8-22 mins   PT G Codes:        Gladys Damme, PT, DPT  Acute Rehabilitation Services  Pager: [phone]   Lehman Prom 10/22/2017, 5:32 PM

## 2017-10-22 NOTE — Transfer of Care (Signed)
Immediate Anesthesia Transfer of Care Note  Patient: Nichole Schwartz  Procedure(s) Performed: TOTAL KNEE ARTHROPLASTY (Left Knee)  Patient Location: PACU  Anesthesia Type:Spinal and MAC combined with regional for post-op pain  Level of Consciousness: awake, alert , oriented and patient cooperative  Airway & Oxygen Therapy: Patient Spontanous Breathing  Post-op Assessment: Report given to RN and Post -op Vital signs reviewed and stable  Post vital signs: Reviewed and stable  Last Vitals:  Vitals:   10/22/17 0717  BP: (!) 156/86  Pulse: 90  Temp: 36.6 C  SpO2: 98%    Last Pain:  Vitals:   10/22/17 0726  TempSrc:   PainSc: 0-No pain      Patients Stated Pain Goal: 5 (27/25/36 6440)  Complications: No apparent anesthesia complications

## 2017-10-22 NOTE — Op Note (Signed)
PATIENT ID:      Nichole Schwartz  MRN:     [id number] DOB/AGE:    56/20/1962 / 56 y.o.       OPERATIVE REPORT    DATE OF PROCEDURE:  10/22/2017       PREOPERATIVE DIAGNOSIS:   LEFT KNEE OSTEOARTHRITIS      Estimated body mass index is 31.72 kg/m as calculated from the following:   Height as of 10/16/17:  (1.676 m).   Weight as of 10/16/17: 196 lb 8 oz (89.1 kg).                                                        POSTOPERATIVE DIAGNOSIS:   LEFT KNEE OSTEOARTHRITIS                                                                      PROCEDURE:  Procedure(s): TOTAL KNEE ARTHROPLASTY Using DepuyAttune RP implants #4L Femur, #4Tibia, 5 mm Attune RP bearing, 38 Patella     SURGEON: Nestor Lewandowsky    ASSISTANT:   Tomi Likens. [employer]   (Present and scrubbed throughout the case, critical for assistance with exposure, retraction, instrumentation, and closure.)         ANESTHESIA: Spinal, 20cc Exparel, 50cc 0.25% Marcaine  EBL: 300  FLUID REPLACEMENT: 1500 crystalloid  TOURNIQUET TIME:  Drains: None  Tranexamic Acid: 1gm IV, 2gm topical  COMPLICATIONS:  None         INDICATIONS FOR PROCEDURE: The patient has  LEFT KNEE OSTEOARTHRITIS, far Var deformities, XR shows bone on bone arthritis, lateral subluxation of tibia. Patient has failed all conservative measures including anti-inflammatory medicines, narcotics, attempts at  exercise and weight loss, cortisone injections and viscosupplementation.  Risks and benefits of surgery have been discussed, questions answered.   DESCRIPTION OF PROCEDURE: The patient identified by armband, received  IV antibiotics, in the holding area at [facility]. Patient taken to the operating room, appropriate anesthetic  monitors were attached, and Spinal anesthesia was  induced. Tourniquet  applied high to the operative thigh. Lateral post and foot positioner  applied to the table, the lower extremity was then prepped and draped  in usual sterile  fashion from the toes to the tourniquet. Time-out procedure was performed. We began the operation, with the knee flexed 120 degrees, by making the anterior midline incision starting at handbreadth above the patella going over the patella 1 cm medial to and 4 cm distal to the tibial tubercle. Small bleeders in the skin and the  subcutaneous tissue identified and cauterized. Transverse retinaculum was incised and reflected medially and a medial parapatellar arthrotomy was accomplished. the patella was everted and theprepatellar fat pad resected. The superficial medial collateral  ligament was then elevated from anterior to posterior along the proximal  flare of the tibia and anterior half of the menisci resected. The knee was hyperflexed exposing bone on bone arthritis. Peripheral and notch osteophytes as well as the cruciate ligaments were then resected. We continued to  work our way around posteriorly along the proximal  tibia, and externally  rotated the tibia subluxing it out from underneath the femur. A McHale  retractor was placed through the notch and a lateral Hohmann retractor  placed, and we then drilled through the proximal tibia in line with the  axis of the tibia followed by an intramedullary guide rod and 2-degree  posterior slope cutting guide. The tibial cutting guide, 3 degree posterior sloped, was pinned into place allowing resection of -1 mm of bone medially and 10 mm of bone laterally. Satisfied with the tibial resection, we then  entered the distal femur 2 mm anterior to the PCL origin with the  intramedullary guide rod and applied the distal femoral cutting guide  set at 9 mm, with 5 degrees of valgus. This was pinned along the  epicondylar axis. At this point, the distal femoral cut was accomplished without difficulty. We then sized for a #4L femoral component and pinned the guide in 3 degrees of external rotation. The chamfer cutting guide was pinned into place. The anterior,  posterior, and chamfer cuts were accomplished without difficulty followed by  the Attune RP box cutting guide and the box cut. We also removed posterior osteophytes from the posterior femoral condyles. At this  time, the knee was brought into full extension. We checked our  extension and flexion gaps and found them symmetric for a 5 mm bearing. Distracting in extension with a lamina spreader, the posterior horns of the menisci were removed, and Exparel, diluted to 60 cc, with 20cc NS, and 20cc 0.5% Marcaine,was injected into the capsule and synovium of the knee. The posterior patella cut was accomplished with the 9.5 mm Attune cutting guide, sized for a 38mm dome, and the fixation pegs drilled.The knee  was then once again hyperflexed exposing the proximal tibia. We sized for a # 4 tibial base plate, applied the smokestack and the conical reamer followed by the the Delta fin keel punch. We then hammered into place the Attune RP trial femoral component, drilled the lugs, inserted a  5 mm trial bearing, trial patellar button, and took the knee through range of motion from 0-130 degrees. No thumb pressure was required for patellar Tracking. At this point, the limb was wrapped with an Esmarch bandage and the tourniquet inflated to 350 mmHg. All trial components were removed, mating surfaces irrigated with pulse lavage, and dried with suction and sponges. 10 cc of the Exparel solution was applied to the cancellus bone of the patella distal femur and proximal tibia.  After waiting 1 minute, the bony surfaces were again, dried with sponges. A double batch of DePuy HV cement with 1500 mg of Zinacef was mixed and applied to all bony metallic mating surfaces except for the posterior condyles of the femur itself. In order, we hammered into place the tibial tray and removed excess cement, the femoral component and removed excess cement. The final Attune RP bearing  was inserted, and the knee brought to full extension with  compression.  The patellar button was clamped into place, and excess cement  removed. While the cement cured the wound was irrigated out with normal saline solution pulse lavage. Ligament stability and patellar tracking were checked and found to be excellent. The parapatellar arthrotomy was closed with  running #1 Vicryl suture. The subcutaneous tissue with 0 and 2-0 undyed  Vicryl suture, and the skin with running 3-0 SQ vicryl. A dressing of Xeroform,  4 x 4, dressing sponges, Webril, and Ace wrap applied. The patient  awakened, and  taken to recovery room without difficulty.   Nestor Lewandowsky 10/22/2017, 10:12 AM

## 2017-10-22 NOTE — Interval H&P Note (Signed)
History and Physical Interval Note:  10/22/2017 8:32 AM  Nichole Schwartz  has presented today for surgery, with the diagnosis of LEFT KNEE OSTEOARTHRITIS  The various methods of treatment have been discussed with the patient and family. After consideration of risks, benefits and other options for treatment, the patient has consented to  Procedure(s): TOTAL KNEE ARTHROPLASTY (Left) as a surgical intervention .  The patient's history has been reviewed, patient examined, no change in status, stable for surgery.  I have reviewed the patient's chart and labs.  Questions were answered to the patient's satisfaction.     Nestor LewandowskyFrank J Alanny Rivers

## 2017-10-22 NOTE — OR Nursing (Signed)
1035:  In&out cath=300cc cyu, per protocol.

## 2017-10-22 NOTE — Anesthesia Postprocedure Evaluation (Signed)
Anesthesia Post Note  Patient: Nichole Schwartz  Procedure(s) Performed: TOTAL KNEE ARTHROPLASTY (Left Knee)     Patient location during evaluation: PACU Anesthesia Type: Spinal Level of consciousness: oriented and awake and alert Pain management: pain level controlled Vital Signs Assessment: post-procedure vital signs reviewed and stable Respiratory status: spontaneous breathing, respiratory function stable and patient connected to nasal cannula oxygen Cardiovascular status: blood pressure returned to baseline and stable Postop Assessment: no headache, no backache and no apparent nausea or vomiting Anesthetic complications: no    Last Vitals:  Vitals:   10/22/17 1045 10/22/17 1100  BP: 108/75 115/73  Pulse: 86 77  Resp: 14 15  Temp: 36.4 C   SpO2: 99% 98%    Last Pain:  Vitals:   10/22/17 0726  TempSrc:   PainSc: 0-No pain                 Trevor IhaStephen A Elsie Sakuma

## 2017-10-22 NOTE — Plan of Care (Signed)
  Nutrition: Adequate nutrition will be maintained 10/22/2017 1650 - Progressing by Darrow BussingArcilla, Niva Murren M, RN   Elimination: Will not experience complications related to bowel motility 10/22/2017 1650 - Progressing by Darrow BussingArcilla, Janki Dike M, RN   Pain Managment: General experience of comfort will improve 10/22/2017 1650 - Progressing by Darrow BussingArcilla, Joaopedro Eschbach M, RN   Safety: Ability to remain free from injury will improve 10/22/2017 1650 - Progressing by Darrow BussingArcilla, Katleen Carraway M, RN

## 2017-10-23 ENCOUNTER — Encounter (HOSPITAL_COMMUNITY): Payer: Self-pay | Admitting: Orthopedic Surgery

## 2017-10-23 LAB — CBC
HCT: 30.7 % — ABNORMAL LOW (ref 36.0–46.0)
Hemoglobin: 9.7 g/dL — ABNORMAL LOW (ref 12.0–15.0)
MCH: 28.4 pg (ref 26.0–34.0)
MCHC: 31.6 g/dL (ref 30.0–36.0)
MCV: 89.8 fL (ref 78.0–100.0)
PLATELETS: 234 10*3/uL (ref 150–400)
RBC: 3.42 MIL/uL — ABNORMAL LOW (ref 3.87–5.11)
RDW: 15.1 % (ref 11.5–15.5)
WBC: 7.4 10*3/uL (ref 4.0–10.5)

## 2017-10-23 LAB — BASIC METABOLIC PANEL
Anion gap: 11 (ref 5–15)
BUN: 19 mg/dL (ref 6–20)
CALCIUM: 8.7 mg/dL — AB (ref 8.9–10.3)
CO2: 23 mmol/L (ref 22–32)
CREATININE: 1.12 mg/dL — AB (ref 0.44–1.00)
Chloride: 104 mmol/L (ref 101–111)
GFR calc Af Amer: >60 mL/min (ref >60)
GFR, EST NON AFRICAN AMERICAN: 54 mL/min — AB (ref >60)
GLUCOSE: 176 mg/dL — AB (ref 65–99)
Potassium: 3.7 mmol/L (ref 3.5–5.1)
Sodium: 138 mmol/L (ref 135–145)

## 2017-10-23 LAB — GLUCOSE, CAPILLARY
GLUCOSE-CAPILLARY: 241 mg/dL — AB (ref 65–99)
GLUCOSE-CAPILLARY: 170 mg/dL — AB (ref 65–99)

## 2017-10-23 MED ORDER — INSULIN ASPART 100 UNIT/ML ~~LOC~~ SOLN
0.0000 [IU] | Freq: Three times a day (TID) | SUBCUTANEOUS | Status: DC
  Administered 2017-10-23: 5 [IU] via SUBCUTANEOUS
  Administered 2017-10-24 (×2): 3 [IU] via SUBCUTANEOUS

## 2017-10-23 NOTE — Plan of Care (Signed)
  Pain Managment: General experience of comfort will improve 10/23/2017 0120 - Progressing by Sidonie Dickensulla, Schylar Wuebker, RN

## 2017-10-23 NOTE — Evaluation (Signed)
Occupational Therapy Evaluation Patient Details Name: Nichole Schwartz MRN: [id number] DOB: 03/21/1961 Today's Date: 10/23/2017    History of Present Illness Pt is a 57 y/o female s/p L TKA. PMH inlucdes DM and HTN.    Clinical Impression   Patient is s/p L tkA surgery resulting in functional limitations due to the deficits listed below (see OT problem list). Pt currently with pain that limits her during the dayand will have help of daughter upon dc home.  Patient will benefit from skilled OT acutely to increase independence and safety with ADLS to allow discharge dc/ home.     Follow Up Recommendations  No OT follow up    Equipment Recommendations  None recommended by OT    Recommendations for Other Services       Precautions / Restrictions Precautions Precautions: Knee Precaution Comments: reviewed knee extension and use of blue foam Restrictions Weight Bearing Restrictions: Yes LLE Weight Bearing: Weight bearing as tolerated      Mobility Bed Mobility         Supine to sit: Supervision        Transfers                      Balance                                           ADL either performed or assessed with clinical judgement   ADL Overall ADL's : Needs assistance/impaired Eating/Feeding: Set up   Grooming: Set up   Upper Body Bathing: Set up   Lower Body Bathing: Moderate assistance                 Toileting - Clothing Manipulation Details (indicate cue type and reason): just returned from bathroom on arrival Tub/ Shower Transfer: Education officer, environmentalWalk-in shower Tub/Shower Transfer Details (indicate cue type and reason): educated on sequence and demo provided. daughter present. patient able to verbalize back to therapist    General ADL Comments: educated on sheet as leg lifter, demonstrates bed level mobility. educated on blue foam. pt with no recall of exercises so educated on heel slides, knee flexion, ankle pumps and ankle flexion  while supine. pt with excellent recall and demo with teach back.   Educated patient on knee full extension with return demonstration, educated shower transfer,never to wash directly on incision site, always use fresh clean linen (one time use then place in laundry), avoid water under bandage and benefits of wrapping dressing, sleeping positioning, avoid putting pillow under knee, educated on use of a belt or sheet as a leg lifter .     Vision   Vision Assessment?: No apparent visual deficits     Perception     Praxis      Pertinent Vitals/Pain Pain Assessment: Faces Faces Pain Scale: Hurts little more Pain Location: l knee Pain Descriptors / Indicators: Operative site guarding Pain Intervention(s): RN gave pain meds during session;Ice applied;Repositioned;Monitored during session     Hand Dominance Right   Extremity/Trunk Assessment Upper Extremity Assessment Upper Extremity Assessment: Overall WFL for tasks assessed   Lower Extremity Assessment Lower Extremity Assessment: Defer to PT evaluation   Cervical / Trunk Assessment Cervical / Trunk Assessment: Normal   Communication Communication Communication: No difficulties   Cognition Arousal/Alertness: Awake/alert Behavior During Therapy: WFL for tasks assessed/performed Overall Cognitive Status: Within Functional Limits for tasks assessed  General Comments       Exercises     Shoulder Instructions      Home Living Family/patient expects to be discharged to:: Private residence Living Arrangements: Children Available Help at Discharge: Family;Available 24 hours/day Type of Home: House Home Access: Stairs to enter [employer] of Steps: flight  Entrance Stairs-Rails: Left;Right Home Layout: Two level;Able to live on main level with bedroom/bathroom     Bathroom Shower/Tub: Producer, television/film/video: Standard     Home Equipment: Environmental consultant - 2  wheels;Cane - single point;Bedside commode;Shower seat;Hand held shower head   Additional Comments: daughter will (A) and is an Charity fundraiser per patient       Prior Functioning/Environment Level of Independence: Independent                 OT Problem List: Decreased strength;Decreased activity tolerance;Impaired balance (sitting and/or standing);Decreased safety awareness;Decreased knowledge of use of DME or AE;Decreased knowledge of precautions;Obesity;Pain      OT Treatment/Interventions: Self-care/ADL training;DME and/or AE instruction;Therapeutic activities;Patient/family education;Balance training    OT Goals(Current goals can be found in the care plan section) Acute Rehab OT Goals Patient Stated Goal: to decrease pain  OT Goal Formulation: With patient Time For Goal Achievement: 10/30/17 Potential to Achieve Goals: Good  OT Frequency: Min 2X/week   Barriers to D/C:            Co-evaluation              AM-PAC PT "6 Clicks" Daily Activity     Outcome Measure Help from another person eating meals?: None Help from another person taking care of personal grooming?: A Little Help from another person toileting, which includes using toliet, bedpan, or urinal?: A Little Help from another person bathing (including washing, rinsing, drying)?: A Little Help from another person to put on and taking off regular upper body clothing?: A Little Help from another person to put on and taking off regular lower body clothing?: A Lot 6 Click Score: 18   End of Session Nurse Communication: Mobility status;Precautions  Activity Tolerance: Patient tolerated treatment well Patient left: in bed;with call bell/phone within reach;with family/visitor present  OT Visit Diagnosis: Unsteadiness on feet (R26.81)                Time: 1610-9604 OT Time Calculation (min): 18 min Charges:  OT General Charges $OT Visit: 1 Visit OT Evaluation $OT Eval Moderate Complexity: 1 Mod G-Codes:       Mateo Flow   OTR/L Pager: [phone] Office: [phone] .   Boone Master B 10/23/2017, 9:16 PM

## 2017-10-23 NOTE — Plan of Care (Signed)
  Nutrition: Adequate nutrition will be maintained 10/23/2017 1320 - Progressing by Darrow BussingArcilla, Rasha Ibe M, RN   Elimination: Will not experience complications related to bowel motility 10/23/2017 1320 - Progressing by Darrow BussingArcilla, Arryanna Holquin M, RN   Pain Managment: General experience of comfort will improve 10/23/2017 1320 - Progressing by Darrow BussingArcilla, Cherylanne Ardelean M, RN   Safety: Ability to remain free from injury will improve 10/23/2017 1320 - Progressing by Darrow BussingArcilla, Raushanah Osmundson M, RN

## 2017-10-23 NOTE — Progress Notes (Signed)
PT Cancellation Note  Patient Details Name: Nichole Schwartz MRN: [id number] DOB: 06/05/1961   Cancelled Treatment:    Reason Eval/Treat Not Completed: Other (comment).  Pt was seen for attempts at therapy and refused even bed exercises, reporting she is very tired.  Will try again in the AM.   Ivar DrapeRuth E Ekin Pilar 10/23/2017, 2:35 PM  Samul Dadauth Darran Gabay, PT MS Acute Rehab Dept. [id number] and [facility] [id number]

## 2017-10-23 NOTE — Care Management Note (Signed)
Case Management Note  Patient Details  Name: Nichole Schwartz First MRN: [id number] Date of Birth: 08/12/1961  Subjective/Objective:    Left TKA                Action/Plan: Spoke to pt and dtr, [place] at bedside. [facility] she was [facility] with [facility]. Contacted [facility] and they are waiting on orders. Faxed orders/F2F, facesheet and progress note to [facility], # [phone] fax [phone]. They are requesting dc summary (not available at this time). She has RW and  Bedside commode at home. Dtr and son-in-law at home to assist with care.    Expected Discharge Date:                Expected Discharge Plan:  Home w Home Health Services  In-House Referral:  NA  Discharge planning Services  CM Consult  Post Acute Care Choice:  Home Health Choice offered to:  Patient  DME Arranged:  N/A DME Agency:  NA  HH Arranged:  PT HH Agency:  [facility]  Status of Service:  Completed, signed off  If discussed at Long Length of Stay Meetings, dates discussed:    Additional Comments:  Elliot CousinShavis, Kateryn Marasigan Ellen, RN 10/23/2017, 2:41 PM

## 2017-10-23 NOTE — Progress Notes (Signed)
Physical Therapy Treatment Patient Details Name: Nichole Schwartz MRN: [id number] DOB: 06/21/1961 Today's Date: 10/23/2017    History of Present Illness Pt is a 57 y/o female s/p L TKA. PMH inlucdes DM and HTN.     PT Comments    Pt practiced steps again and increased ambulation distance and is feeling confident about going home. Still having difficulty with rising from lower surfaces but will have assist at home. Needed vc's and min-guard from bed. Tolerated 200' ambulation with RW and min-guard A.     Follow Up Recommendations  Follow surgeon's recommendation for DC plan and follow-up therapies     Equipment Recommendations  None recommended by PT    Recommendations for Other Services       Precautions / Restrictions Precautions Precautions: Knee Precaution Comments: reviewed proper positioning and use of zero knee foam Restrictions Weight Bearing Restrictions: Yes LLE Weight Bearing: Weight bearing as tolerated    Mobility  Bed Mobility Overal bed mobility: Needs Assistance Bed Mobility: Supine to Sit;Sit to Supine     Supine to sit: Supervision Sit to supine: Min assist   General bed mobility comments: pt able to slide LLE off bed for supine to sit. Min A to LLE for return to supine  Transfers Overall transfer level: Needs assistance Equipment used: Rolling walker (2 wheeled) Transfers: Sit to/from Stand Sit to Stand: Min guard         General transfer comment: vc's for hand placement and took pt several tries but was able to fo stand from bed with min-guard A. Cues for fwd wt shift  Ambulation/Gait Ambulation/Gait assistance: Min guard Ambulation Distance (Feet): 200 Feet Assistive device: Rolling walker (2 wheeled) Gait Pattern/deviations: Step-through pattern;Decreased stride length;Decreased weight shift to left;Wide base of support;Trunk flexed;Antalgic Gait velocity: reduced Gait velocity interpretation: Below normal speed for age/gender General Gait  Details: pt began with hopping pattern but was able to progress to normal stepping pattern with vc's and practice. Increased pace once she stopped hopping. vc's for less wt through UE's and more wt through LE's   Stairs Stairs: Yes   Stair Management: One rail Right;Sideways;Step to pattern Number of Stairs: 10 General stair comments: practiced sideways with rail as well as fwd with one rail and HHA on other side  Wheelchair Mobility    Modified Rankin (Stroke Patients Only)       Balance Overall balance assessment: Needs assistance Sitting-balance support: Feet supported Sitting balance-Leahy Scale: Good     Standing balance support: Bilateral upper extremity supported;During functional activity Standing balance-Leahy Scale: Fair Standing balance comment: requires UE support for all dynamic activity                            Cognition Arousal/Alertness: Awake/alert Behavior During Therapy: WFL for tasks assessed/performed Overall Cognitive Status: Within Functional Limits for tasks assessed                                        Exercises Total Joint Exercises Quad Sets: AROM;Left;10 reps Heel Slides: AROM;Left;10 reps;Seated(partial range ) Hip ABduction/ADduction: AROM;Left;5 reps;Supine Straight Leg Raises: AAROM;Left;5 reps;Supine Long Arc Quad: AROM;Left;10 reps;Seated Knee Flexion: AAROM;Left;5 reps;Seated Goniometric ROM: 10-80    General Comments General comments (skin integrity, edema, etc.): discussed activity level upon d/c and car transfer      Pertinent Vitals/Pain Pain Assessment: 0-10 Pain Score:  3  Pain Location: L knee  Pain Descriptors / Indicators: Aching Pain Intervention(s): Limited activity within patient's tolerance;Monitored during session    Home Living                      Prior Function            PT Goals (current goals can now be found in the care plan section) Acute Rehab PT  Goals Patient Stated Goal: to decrease pain  PT Goal Formulation: With patient Time For Goal Achievement: 10/29/17 Potential to Achieve Goals: Good Progress towards PT goals: Progressing toward goals    Frequency    7X/week      PT Plan Current plan remains appropriate    Schwartz-evaluation              AM-PAC PT "6 Clicks" Daily Activity  Outcome Measure  Difficulty turning over in bed (including adjusting bedclothes, sheets and blankets)?: A Little Difficulty moving from lying on back to sitting on the side of the bed? : A Little Difficulty sitting down on and standing up from a chair with arms (e.g., wheelchair, bedside commode, etc,.)?: A Little Help needed moving to and from a bed to chair (including a wheelchair)?: A Little Help needed walking in hospital room?: A Little Help needed climbing 3-5 steps with a railing? : A Little 6 Click Score: 18    End of Session   Activity Tolerance: Patient tolerated treatment well Patient left: with call bell/phone within reach;with family/visitor present;in bed Nurse Communication: Mobility status PT Visit Diagnosis: Unsteadiness on feet (R26.81);Other abnormalities of gait and mobility (R26.89);Pain Pain - Right/Left: Left Pain - part of body: Knee     Time: 1531-1605 PT Time Calculation (min) (ACUTE ONLY): 34 min  Charges:  $Gait Training: 8-22 mins $Therapeutic Exercise: 8-22 mins                    G Codes:       Nichole Schwartz, PT  Acute Rehab Services  [phone]    Nichole Schwartz 10/23/2017, 4:11 PM

## 2017-10-23 NOTE — Progress Notes (Signed)
PATIENT ID: Nichole Schwartz  MRN: [id number]  DOB/AGE:  56/22/1962 / 56 y.o.  1 Day Post-Op Procedure(s) (LRB): TOTAL KNEE ARTHROPLASTY (Left)    PROGRESS NOTE Subjective: Patient is alert, oriented, no Nausea, no Vomiting, yes passing gas. Taking PO well. Denies SOB, Chest or Calf Pain. Using Incentive Spirometer, PAS in place. Ambulate WBAT, Patient reports pain as 2/10 .    Objective: Vital signs in last 24 hours: Vitals:   10/22/17 1300 10/22/17 2047 10/23/17 0009 10/23/17 0450  BP: 132/78 116/70 118/72 96/65  Pulse: 69 85 80 74  Resp: 16 16 16 16   Temp: 97.9 F (36.6 C) 97.7 F (36.5 C) 97.8 F (36.6 C) 98 F (36.7 C)  TempSrc: Oral Oral Oral Oral  SpO2: 100% 98% 98% 98%      Intake/Output from previous day: I/O last 3 completed shifts: In: 1300 [P.O.:600; I.V.:700] Out: 400 [Urine:300; Blood:100]   Intake/Output this shift: No intake/output data recorded.   LABORATORY DATA: Recent Labs    10/22/17 0713 10/22/17 1047 10/23/17 0637  WBC  --   --  7.4  HGB  --   --  9.7*  HCT  --   --  30.7*  PLT  --   --  234  GLUCAP 159* 129*  --     Examination: Neurologically intact ABD soft Neurovascular intact Sensation intact distally Intact pulses distally Dorsiflexion/Plantar flexion intact Incision: no drainage No cellulitis present Compartment soft}  Assessment:   1 Day Post-Op Procedure(s) (LRB): TOTAL KNEE ARTHROPLASTY (Left) ADDITIONAL DIAGNOSIS: Expected Acute Blood Loss Anemia,   Plan: PT/OT WBAT, AROM and PROM  DVT Prophylaxis:  SCDx72hrs, ASA 325 mg BID x 2 weeks DISCHARGE PLAN: Home,Today if patient passes all PT goals DISCHARGE NEEDS: HHPT, Walker and 3-in-1 comode seat     Nestor LewandowskyFrank J Walid Haig 10/23/2017, 7:42 AM

## 2017-10-23 NOTE — Progress Notes (Addendum)
Physical Therapy Treatment Patient Details Name: Nichole Schwartz MRN: [id number] DOB: [birth year] Today's Date: 10/23/2017    History of Present Illness Pt is a 57 y/o female s/p L TKA. PMH inlucdes DM and HTN.     PT Comments    Pt was seen for evaluation of mobility after TKA, noted her control of standing was good after first attempts.  Pt is up to BR then walked to chair and walked up stairs.  Her control of pain is good and not impacted by the effort to walk and climb stairs today.  Follow acutely as planned for strengthening, ROM and balance.   Follow Up Recommendations  DC plan and follow up therapy as arranged by surgeon;Supervision for mobility/OOB     Equipment Recommendations  None recommended by PT    Recommendations for Other Services       Precautions / Restrictions Precautions Precautions: Knee Precaution Booklet Issued: Yes (comment) Precaution Comments: reviewed protection of knee surgery Restrictions Weight Bearing Restrictions: Yes LLE Weight Bearing: Weight bearing as tolerated    Mobility  Bed Mobility Overal bed mobility: Needs Assistance Bed Mobility: Supine to Sit     Supine to sit: Min guard;Min assist     General bed mobility comments: Min A for LLE assist. Required increased time and use of bed rails and elevated HOB.   Transfers Overall transfer level: Needs assistance Equipment used: Rolling walker (2 wheeled) Transfers: Sit to/from Stand Sit to Stand: Mod assist(from lower surfaces)         General transfer comment: mod assist to power up and mod to steady briefly, then min to min guard  Ambulation/Gait Ambulation/Gait assistance: Min guard;Min assist Ambulation Distance (Feet): 30 Feet Assistive device: Rolling walker (2 wheeled);1 person hand held assist Gait Pattern/deviations: Step-to pattern;Step-through pattern;Decreased stride length;Decreased weight shift to left;Wide base of support;Trunk flexed;Antalgic Gait velocity:  reduced Gait velocity interpretation: Below normal speed for age/gender     Stairs Stairs: Yes   Stair Management: Two rails Number of Stairs: 10 General stair comments: went up and down the 10 stairs  Wheelchair Mobility    Modified Rankin (Stroke Patients Only)       Balance Overall balance assessment: Needs assistance Sitting-balance support: Feet supported Sitting balance-Leahy Scale: Good     Standing balance support: Bilateral upper extremity supported;During functional activity Standing balance-Leahy Scale: Fair Standing balance comment: requires UE support for all dynamic activity                            Cognition Arousal/Alertness: Awake/alert Behavior During Therapy: WFL for tasks assessed/performed Overall Cognitive Status: Within Functional Limits for tasks assessed                                        Exercises Total Joint Exercises Ankle Circles/Pumps: AROM;Both;5 reps    General Comments General comments (skin integrity, edema, etc.): daughter attended session      Pertinent Vitals/Pain Pain Assessment: 0-10 Pain Score: 3  Pain Location: L knee  Pain Descriptors / Indicators: Aching;Operative site guarding Pain Intervention(s): Limited activity within patient's tolerance;Monitored during session;Premedicated before session;Repositioned;Ice applied    Home Living                      Prior Function            PT  Goals (current goals can now be found in the care plan section) Acute Rehab PT Goals Patient Stated Goal: to decrease pain  Progress towards PT goals: Progressing toward goals    Frequency    7X/week      PT Plan Current plan remains appropriate    Co-evaluation              AM-PAC PT "6 Clicks" Daily Activity  Outcome Measure  Difficulty turning over in bed (including adjusting bedclothes, sheets and blankets)?: A Little Difficulty moving from lying on back to sitting  on the side of the bed? : Unable Difficulty sitting down on and standing up from a chair with arms (e.g., wheelchair, bedside commode, etc,.)?: Unable Help needed moving to and from a bed to chair (including a wheelchair)?: A Little Help needed walking in hospital room?: A Little Help needed climbing 3-5 steps with a railing? : A Little 6 Click Score: 14    End of Session Equipment Utilized During Treatment: Gait belt Activity Tolerance: Patient limited by fatigue;Patient limited by pain Patient left: in chair;with call bell/phone within reach;with family/visitor present Nurse Communication: Mobility status PT Visit Diagnosis: Unsteadiness on feet (R26.81);Other abnormalities of gait and mobility (R26.89);Pain Pain - Right/Left: Left Pain - part of body: Knee     Time: 7829-56211055-1128 PT Time Calculation (min) (ACUTE ONLY): 33 min  Charges:  $Gait Training: 8-22 mins $Therapeutic Exercise: 8-22 mins                    G Codes:  Functional Assessment Tool Used: AM-PAC 6 Clicks Basic Mobility   Ivar DrapeRuth E Saylah Ketner 10/23/2017, 2:34 PM   Samul Dadauth Rockie Vawter, PT MS Acute Rehab Dept. Number: [facility] [phone] and [facility] [phone]

## 2017-10-24 LAB — CBC
HCT: 30.6 % — ABNORMAL LOW (ref 36.0–46.0)
Hemoglobin: 9.9 g/dL — ABNORMAL LOW (ref 12.0–15.0)
MCH: 28.3 pg (ref 26.0–34.0)
MCHC: 32.4 g/dL (ref 30.0–36.0)
MCV: 87.4 fL (ref 78.0–100.0)
Platelets: 242 10*3/uL (ref 150–400)
RBC: 3.5 MIL/uL — ABNORMAL LOW (ref 3.87–5.11)
RDW: 14.9 % (ref 11.5–15.5)
WBC: 8.2 10*3/uL (ref 4.0–10.5)

## 2017-10-24 LAB — GLUCOSE, CAPILLARY
GLUCOSE-CAPILLARY: 160 mg/dL — AB (ref 65–99)
Glucose-Capillary: 153 mg/dL — ABNORMAL HIGH (ref 65–99)

## 2017-10-24 MED ORDER — PNEUMOCOCCAL VAC POLYVALENT 25 MCG/0.5ML IJ INJ
0.5000 mL | INJECTION | Freq: Once | INTRAMUSCULAR | Status: AC
  Administered 2017-10-24: 0.5 mL via INTRAMUSCULAR
  Filled 2017-10-24: qty 0.5

## 2017-10-24 MED ORDER — INFLUENZA VAC SPLIT QUAD 0.5 ML IM SUSY
0.5000 mL | PREFILLED_SYRINGE | Freq: Once | INTRAMUSCULAR | Status: AC
  Administered 2017-10-24: 0.5 mL via INTRAMUSCULAR
  Filled 2017-10-24: qty 0.5

## 2017-10-24 NOTE — Progress Notes (Signed)
Physical Therapy Treatment Patient Details Name: Nichole Schwartz MRN: [id number] DOB: 10/19/1960 Today's Date: 10/24/2017    History of Present Illness Pt is a 57 y/o female s/p L TKA. PMH inlucdes DM and HTN.     PT Comments    Patient is making progress toward mobility goal. Current plan remains appropriate.    Follow Up Recommendations  Follow surgeon's recommendation for DC plan and follow-up therapies     Equipment Recommendations  None recommended by PT    Recommendations for Other Services       Precautions / Restrictions Precautions Precautions: Knee Precaution Booklet Issued: Yes (comment) Precaution Comments: reviewed knee extension and use of blue foam Restrictions Weight Bearing Restrictions: Yes LLE Weight Bearing: Weight bearing as tolerated    Mobility  Bed Mobility Overal bed mobility: Modified Independent Bed Mobility: Supine to Sit           General bed mobility comments: increased time and effort; no physical assist needed; no use of rails and HOB flat  Transfers Overall transfer level: Needs assistance Equipment used: Rolling walker (2 wheeled) Transfers: Sit to/from Stand Sit to Stand: Min guard         General transfer comment: cues for safe hand placement; min guard for safety  Ambulation/Gait Ambulation/Gait assistance: Min guard;Supervision Ambulation Distance (Feet): 160 Feet Assistive device: Rolling walker (2 wheeled) Gait Pattern/deviations: Step-through pattern;Decreased weight shift to left;Antalgic;Decreased stance time - left;Decreased step length - right;Wide base of support;Trunk flexed Gait velocity: decreased   General Gait Details: cues for sequencing and safe proximity to RW; pt with improved weightbearing and L heel strike/toe off with increased distance   Stairs            Wheelchair Mobility    Modified Rankin (Stroke Patients Only)       Balance Overall balance assessment: Needs  assistance Sitting-balance support: Feet supported Sitting balance-Leahy Scale: Good     Standing balance support: Bilateral upper extremity supported;During functional activity Standing balance-Leahy Scale: Fair                              Cognition Arousal/Alertness: Awake/alert Behavior During Therapy: WFL for tasks assessed/performed Overall Cognitive Status: Within Functional Limits for tasks assessed                                        Exercises Total Joint Exercises Quad Sets: AROM;Left;10 reps Short Arc QuadBarbaraann Boys: AAROM;Left;10 reps Heel Slides: AROM;Left;10 reps;AAROM Hip ABduction/ADduction: AROM;Left;Supine;10 reps    General Comments        Pertinent Vitals/Pain Pain Assessment: Faces Faces Pain Scale: Hurts little more Pain Location: L Knee Pain Descriptors / Indicators: Grimacing;Guarding;Sore Pain Intervention(s): Limited activity within patient's tolerance;Monitored during session    Home Living                      Prior Function            PT Goals (current goals can now be found in the care plan section) Acute Rehab PT Goals PT Goal Formulation: With patient Time For Goal Achievement: 10/29/17 Potential to Achieve Goals: Good Progress towards PT goals: Progressing toward goals    Frequency    7X/week      PT Plan Current plan remains appropriate    Co-evaluation  AM-PAC PT "6 Clicks" Daily Activity  Outcome Measure  Difficulty turning over in bed (including adjusting bedclothes, sheets and blankets)?: A Little Difficulty moving from lying on back to sitting on the side of the bed? : A Little Difficulty sitting down on and standing up from a chair with arms (e.g., wheelchair, bedside commode, etc,.)?: Unable Help needed moving to and from a bed to chair (including a wheelchair)?: A Little Help needed walking in hospital room?: A Little Help needed climbing 3-5 steps with a  railing? : A Little 6 Click Score: 16    End of Session Equipment Utilized During Treatment: Gait belt Activity Tolerance: Patient tolerated treatment well Patient left: with call bell/phone within reach;with family/visitor present;Other (comment)(pt standing in room with OT ) Nurse Communication: Mobility status PT Visit Diagnosis: Unsteadiness on feet (R26.81);Other abnormalities of gait and mobility (R26.89);Pain Pain - Right/Left: Left Pain - part of body: Knee     Time: 8119-1478 PT Time Calculation (min) (ACUTE ONLY): 28 min  Charges:  $Gait Training: 8-22 mins $Therapeutic Exercise: 8-22 mins                    G Codes:       Erline Levine, PTA Pager: [phone]     Carolynne Edouard 10/24/2017, 9:56 AM

## 2017-10-24 NOTE — Progress Notes (Signed)
Patient for discharge home accompanied by her daughter. Currently eating lunch. Medications and discharge instructions explained to the patient. She verbalized understanding. Copies given to her including original prescription. IV saline lock removed.

## 2017-10-24 NOTE — Progress Notes (Signed)
Occupational Therapy Treatment Patient Details Name: Nichole Schwartz MRN: [id number] DOB: 08-Mar-1961 Today's Date: 10/24/2017    History of present illness Pt is a 57 y/o female s/p L TKA. PMH inlucdes DM and HTN.    OT comments  Pt is adequate level for d/c home with family support. Pt reports all DME was delivered to her house including a 3n1. Pt reports feeling confident with session and ready to d/c home.    Follow Up Recommendations  No OT follow up    Equipment Recommendations  None recommended by OT    Recommendations for Other Services      Precautions / Restrictions Precautions Precautions: Knee Precaution Booklet Issued: Yes (comment) Precaution Comments: reviewed knee extension and use of blue foam Restrictions Weight Bearing Restrictions: Yes LLE Weight Bearing: Weight bearing as tolerated       Mobility Bed Mobility Overal bed mobility: Modified Independent Bed Mobility: Supine to Sit           General bed mobility comments: met at door with PTA Kellyn and passed to start OT session  Transfers Overall transfer level: Needs assistance Equipment used: Rolling walker (2 wheeled) Transfers: Sit to/from Stand Sit to Stand: Min guard         General transfer comment: cues for hand placement     Balance Overall balance assessment: Needs assistance Sitting-balance support: Feet supported Sitting balance-Leahy Scale: Good     Standing balance support: Bilateral upper extremity supported;During functional activity Standing balance-Leahy Scale: Fair                             ADL either performed or assessed with clinical judgement   ADL Overall ADL's : Needs assistance/impaired     Grooming: Oral care;Wash/dry face;Wash/dry hands;Set up;Standing                   Toilet Transfer: Min guard;Ambulation;BSC;RW Toilet Transfer Details (indicate cue type and reason): educated on hand placement and backup safely. pt has all DME  already at home per patient.  Toileting- Clothing Manipulation and Hygiene: Supervision/safety Toileting - Clothing Manipulation Details (indicate cue type and reason): lateral lean sitting Tub/ Shower Transfer: Min guard;Walk-in shower;Rolling walker Tub/Shower Transfer Details (indicate cue type and reason): min cues for sequence and safety. poor recal from 10/23/17 Functional mobility during ADLs: Min guard;Rolling walker General ADL Comments: pt with daughter asleep in room whom is a 3rd shift Therapist, sports. one grandson present at this time in the room. pt complete bed and ambulation with PTA prior to OT session ( see notes)     Vision       Perception     Praxis      Cognition Arousal/Alertness: Awake/alert Behavior During Therapy: WFL for tasks assessed/performed Overall Cognitive Status: Within Functional Limits for tasks assessed                                          Exercises Exercises: Total Joint Total Joint Exercises Quad Sets: AROM;Left;10 reps Short Arc QuadSinclair Ship;Left;10 reps Heel Slides: AROM;Left;10 reps;AAROM Hip ABduction/ADduction: AROM;Left;Supine;10 reps   Shoulder Instructions       General Comments      Pertinent Vitals/ Pain       Pain Assessment: Faces Faces Pain Scale: Hurts little more Pain Location: L Knee Pain Descriptors / Indicators: Operative site  guarding Pain Intervention(s): Monitored during session  Home Living                                          Prior Functioning/Environment              Frequency  Min 2X/week        Progress Toward Goals  OT Goals(current goals can now be found in the care plan section)  Progress towards OT goals: Progressing toward goals  Acute Rehab OT Goals Patient Stated Goal: to decrease pain  OT Goal Formulation: With patient Time For Goal Achievement: 10/30/17 Potential to Achieve Goals: Good ADL Goals Pt Will Perform Tub/Shower Transfer: Shower  transfer;ambulating;rolling walker;with min guard assist Additional ADL Goal #1: pt will complete bed mobility mod i as precursor to adls.  Plan Discharge plan remains appropriate    Co-evaluation                 AM-PAC PT "6 Clicks" Daily Activity     Outcome Measure   Help from another person eating meals?: None Help from another person taking care of personal grooming?: A Little Help from another person toileting, which includes using toliet, bedpan, or urinal?: A Little Help from another person bathing (including washing, rinsing, drying)?: A Little Help from another person to put on and taking off regular upper body clothing?: A Little Help from another person to put on and taking off regular lower body clothing?: A Lot 6 Click Score: 18    End of Session Equipment Utilized During Treatment: Gait belt;Rolling walker  OT Visit Diagnosis: Unsteadiness on feet (R26.81)   Activity Tolerance Patient tolerated treatment well   Patient Left in chair;with call bell/phone within reach;with family/visitor present   Nurse Communication Mobility status;Precautions        Time: 8242-3536 OT Time Calculation (min): 36 min  Charges: OT General Charges $OT Visit: 1 Visit OT Treatments $Self Care/Home Management : 23-37 mins   Jeri Modena   OTR/L Pager: [phone] Office: [phone] .    Parke Poisson B 10/24/2017, 10:18 AM

## 2017-10-24 NOTE — Discharge Summary (Signed)
Patient ID: Nichole Schwartz MRN: [id number] DOB/AGE: 01-25-1961 57 y.o.  Admit date: 10/22/2017 Discharge date: 10/24/2017  Admission Diagnoses:  Principal Problem:   Degenerative arthritis of left knee Active Problems:   Primary osteoarthritis of left knee   Discharge Diagnoses:  Same  Past Medical History:  Diagnosis Date  . Arthritis   . Diabetes mellitus without complication (HCC)   . Hypertension   . Hypothyroidism     Surgeries: Procedure(s): TOTAL KNEE ARTHROPLASTY on 10/22/2017   Consultants:   Discharged Condition: Improved  Hospital Course: Lynette Topete is an 57 y.o. female who was admitted 10/22/2017 for operative treatment ofDegenerative arthritis of left knee. Patient has severe unremitting pain that affects sleep, daily activities, and work/hobbies. After pre-op clearance the patient was taken to the operating room on 10/22/2017 and underwent  Procedure(s): TOTAL KNEE ARTHROPLASTY.    Patient was given perioperative antibiotics:  Anti-infectives (From admission, onward)   Start     Dose/Rate Route Frequency Ordered Stop   10/22/17 0730  ceFAZolin (ANCEF) IVPB 2g/100 mL premix     2 g 200 mL/hr over 30 Minutes Intravenous To ShortStay Surgical 10/19/17 1031 10/22/17 0918       Patient was given sequential compression devices, early ambulation, and chemoprophylaxis to prevent DVT.  Patient benefited maximally from hospital stay and there were no complications.    Recent vital signs:  Patient Vitals for the past 24 hrs:  BP Temp Temp src Pulse Resp SpO2  10/24/17 0443 - 98.7 F (37.1 C) Oral 92 18 96 %  10/23/17 1900 139/84 99 F (37.2 C) Oral (!) 101 18 95 %  10/23/17 1409 (!) 141/87 97.8 F (36.6 C) Oral 96 17 98 %     Recent laboratory studies:  Recent Labs    10/23/17 0637 10/24/17 0640  WBC 7.4 8.2  HGB 9.7* 9.9*  HCT 30.7* 30.6*  PLT 234 242  NA 138  --   K 3.7  --   CL 104  --   CO2 23  --   BUN 19  --   CREATININE 1.12*  --    GLUCOSE 176*  --   CALCIUM 8.7*  --      Discharge Medications:   Allergies as of 10/24/2017   No Known Allergies     Medication List    STOP taking these medications   meloxicam 15 MG tablet Commonly known as:  MOBIC     TAKE these medications   aspirin EC 325 MG tablet Take 1 tablet (325 mg total) by mouth 2 (two) times daily.   BIOTIN PO Take 1 tablet by mouth daily.   ergocalciferol 50000 units capsule Commonly known as:  VITAMIN D2 Take 50,000 Units by mouth every Monday.   FISH OIL PO Take 3 capsules by mouth daily.   glimepiride 4 MG tablet Commonly known as:  AMARYL Take 4 mg by mouth daily with breakfast.   levothyroxine 88 MCG tablet Commonly known as:  SYNTHROID, LEVOTHROID Take 88 mcg by mouth daily before breakfast.   losartan 50 MG tablet Commonly known as:  COZAAR Take 50 mg by mouth daily.   metFORMIN 500 MG tablet Commonly known as:  GLUCOPHAGE Take 500-1,000 mg by mouth See admin instructions. Take 500 mg by mouth in the morning and take 1000 mg by mouth in the evening   oxyCODONE-acetaminophen 5-325 MG tablet Commonly known as:  PERCOCET/ROXICET Take 1 tablet by mouth every 4 (four) hours as needed for severe pain.  simvastatin 40 MG tablet Commonly known as:  ZOCOR Take 40 mg by mouth 3 (three) times a week.   tiZANidine 2 MG tablet Commonly known as:  ZANAFLEX Take 1 tablet (2 mg total) by mouth every 6 (six) hours as needed for muscle spasms.            Durable Medical Equipment  (From admission, onward)        Start     Ordered   10/22/17 1300  DME Walker rolling  Once    Question:  Patient needs a walker to treat with the following condition  Answer:  Status post total left knee replacement   10/22/17 1259   10/22/17 1300  DME 3 n 1  Once     10/22/17 1259   10/22/17 1300  DME Bedside commode  Once    Question:  Patient needs a bedside commode to treat with the following condition  Answer:  Status post total left  knee replacement   10/22/17 1259      Diagnostic Studies: Dg Chest 2 View  Result Date: 10/16/2017 CLINICAL DATA:  Preop for knee replacement surgery. EXAM: CHEST  2 VIEW COMPARISON:  None. FINDINGS: The heart size and mediastinal contours are within normal limits. Both lungs are clear. The visualized skeletal structures are unremarkable. IMPRESSION: No active cardiopulmonary disease. Electronically Signed   By: Lupita RaiderJames  Green Jr, M.D.   On: 10/16/2017 20:52    Disposition: Final discharge disposition not confirmed  Discharge Instructions    Call MD / Call 911   Complete by:  As directed    If you experience chest pain or shortness of breath, CALL 911 and be transported to the hospital emergency room.  If you develope a fever above 101 F, pus (white drainage) or increased drainage or redness at the wound, or calf pain, call your surgeon's office.   Constipation Prevention   Complete by:  As directed    Drink plenty of fluids.  Prune juice may be helpful.  You may use a stool softener, such as Colace (over the counter) 100 mg twice a day.  Use MiraLax (over the counter) for constipation as needed.   Diet - low sodium heart healthy   Complete by:  As directed    Driving restrictions   Complete by:  As directed    No driving for 2 weeks   Increase activity slowly as tolerated   Complete by:  As directed    Patient may shower   Complete by:  As directed    You may shower without a dressing once there is no drainage.  Do not wash over the wound.  If drainage remains, cover wound with plastic wrap and then shower.      Follow-up Information    Gean Birchwoodowan, Frank, MD Follow up in 2 week(s).   Specialty:  Orthopedic Surgery Contact information: [address] [place] [address] [phone]        [facility] Follow up.   Why:  Home Health Physical Therapy-agency will call to arrange initial visit Contact information: [phone]           Signed: Dannielle Burnric  Deeanne Deininger 10/24/2017, 8:14 AM

## 2017-10-24 NOTE — Progress Notes (Signed)
PATIENT ID: Nichole Schwartz  MRN: [id number]  DOB/AGE:  05/24/1961 / 57 y.o.  2 Days Post-Op Procedure(s) (LRB): TOTAL KNEE ARTHROPLASTY (Left)    PROGRESS NOTE Subjective: Patient is alert, oriented, no Nausea, no Vomiting, yes passing gas. Taking PO well. Denies SOB, Chest or Calf Pain. Using Incentive Spirometer, PAS in place. Ambulate WBAT with pt walking 200 ft with therapy, Patient reports pain as 3-4/10 .    Objective: Vital signs in last 24 hours: Vitals:   10/23/17 0450 10/23/17 1409 10/23/17 1900 10/24/17 0443  BP: 96/65 (!) 141/87 139/84   Pulse: 74 96 (!) 101 92  Resp: 16 17 18 18   Temp: 98 F (36.7 C) 97.8 F (36.6 C) 99 F (37.2 C) 98.7 F (37.1 C)  TempSrc: Oral Oral Oral Oral  SpO2: 98% 98% 95% 96%      Intake/Output from previous day: I/O last 3 completed shifts: In: 720 [P.O.:720] Out: -    Intake/Output this shift: No intake/output data recorded.   LABORATORY DATA: Recent Labs    10/23/17 0637 10/23/17 1652 10/23/17 2138 10/24/17 0638 10/24/17 0640  WBC 7.4  --   --   --  8.2  HGB 9.7*  --   --   --  9.9*  HCT 30.7*  --   --   --  30.6*  PLT 234  --   --   --  242  NA 138  --   --   --   --   K 3.7  --   --   --   --   CL 104  --   --   --   --   CO2 23  --   --   --   --   BUN 19  --   --   --   --   CREATININE 1.12*  --   --   --   --   GLUCOSE 176*  --   --   --   --   GLUCAP  --  241* 170* 160*  --   CALCIUM 8.7*  --   --   --   --     Examination: Neurologically intact Neurovascular intact Sensation intact distally Intact pulses distally Dorsiflexion/Plantar flexion intact Incision: dressing C/D/I and no drainage No cellulitis present Compartment soft}  Assessment:   2 Days Post-Op Procedure(s) (LRB): TOTAL KNEE ARTHROPLASTY (Left) ADDITIONAL DIAGNOSIS: Expected Acute Blood Loss Anemia,   Plan: PT/OT WBAT, AROM and PROM  DVT Prophylaxis:  SCDx72hrs, ASA 325 mg BID x 2 weeks DISCHARGE PLAN: Home DISCHARGE NEEDS: HHPT, Walker  and 3-in-1 comode seat     Dannielle Burnric Dalores Weger 10/24/2017, 8:12 AM

## 2017-12-25 ENCOUNTER — Other Ambulatory Visit: Payer: Self-pay | Admitting: Orthopedic Surgery

## 2018-01-24 NOTE — Pre-Procedure Instructions (Signed)
Nichole Schwartz  01/24/2018      [id number] [place], [place] - [address] AT [facility] [place] [address] [place] [address] Phone: [phone] Fax: [phone]    Your procedure is scheduled on  Wednesday 02/06/18  Report to [facility] Admitting at 1045 A.M.  Call this number if you have problems the morning of surgery:  [phone]   Remember:  No food OR DRINK after midnight.    Take these medicines the morning of surgery with A SIP OF WATER- LEVOTHYROXINE  7 days prior to surgery STOP taking any Aspirin(unless otherwise instructed by your surgeon), Aleve, Naproxen, Ibuprofen, Motrin, Advil, Goody's, BC's, all herbal medications, fish oil, and all vitamins    Do not wear jewelry, make-up or nail polish.  Do not wear lotions, powders, or perfumes, or deodorant.  Do not shave 48 hours prior to surgery.  Men may shave face and neck.  Do not bring valuables to the hospital.  [facility] is not responsible for any belongings or valuables.  Contacts, dentures or bridgework may not be worn into surgery.  Leave your suitcase in the car.  After surgery it may be brought to your room.  For patients admitted to the hospital, discharge time will be determined by your treatment team.  Patients discharged the day of surgery will not be allowed to drive home.   Name and phone number of your driver:  Special instructions:  Nichole Schwartz - Preparing for Surgery  Before surgery, you can play an important role.  Because skin is not sterile, your skin needs to be as free of germs as possible.  You can reduce the number of germs on you skin by washing with CHG (chlorahexidine gluconate) soap before surgery.  CHG is an antiseptic cleaner which kills germs and bonds with the skin to continue killing germs even after washing.  Oral Hygiene is also important in reducing the risk of infection.  Remember to brush your teeth the morning of surgery.  Please DO NOT  use if you have an allergy to CHG or antibacterial soaps.  If your skin becomes reddened/irritated stop using the CHG and inform your nurse when you arrive at Short Stay.  Do not shave (including legs and underarms) for at least 48 hours prior to the first CHG shower.  You may shave your face.  Please follow these instructions carefully:   1.  Shower with CHG Soap the night before surgery and the morning of Surgery.  2.  If you choose to wash your hair, wash your hair first as usual with your normal shampoo.  3.  After you shampoo, rinse your hair and body thoroughly to remove the shampoo. 4.  Use CHG as you would any other liquid soap.  You can apply chg directly to the skin and wash gently with a      scrungie or washcloth.           5.  Apply the CHG Soap to your body ONLY FROM THE NECK DOWN.   Do not use on open wounds or open sores. Avoid contact with your eyes, ears, mouth and genitals (private parts).  Wash genitals (private parts) with your normal soap.  6.  Wash thoroughly, paying special attention to the area where your surgery will be performed.  7.  Thoroughly rinse your body with warm water from the neck down.  8.  DO NOT shower/wash with your normal soap after using and  rinsing off the CHG Soap.  9.  Pat yourself dry with a clean towel.            10.  Wear clean pajamas.            11.  Place clean sheets on your bed the night of your first shower and do not sleep with pets.  Day of Surgery  Do not apply any lotions/deoderants the morning of surgery.   Please wear clean clothes to the hospital/surgery center. Remember to brush your teeth.     Please read over the following fact sheets that you were given. MRSA Information and Surgical Site Infection Prevention

## 2018-01-25 ENCOUNTER — Encounter (HOSPITAL_COMMUNITY): Payer: Self-pay

## 2018-01-25 ENCOUNTER — Encounter (HOSPITAL_COMMUNITY)
Admission: RE | Admit: 2018-01-25 | Discharge: 2018-01-25 | Disposition: A | Discharge status: Home / Self Care | Payer: Medicare HMO | Source: Ambulatory Visit | Attending: Orthopedic Surgery | Admitting: Orthopedic Surgery

## 2018-01-25 ENCOUNTER — Other Ambulatory Visit: Payer: Self-pay

## 2018-01-25 DIAGNOSIS — Z01812 Encounter for preprocedural laboratory examination: Secondary | ICD-10-CM | POA: Diagnosis present

## 2018-01-25 DIAGNOSIS — E119 Type 2 diabetes mellitus without complications: Secondary | ICD-10-CM | POA: Diagnosis not present

## 2018-01-25 LAB — TYPE AND SCREEN
ABO/RH(D): AB POS
Antibody Screen: NEGATIVE

## 2018-01-25 LAB — SURGICAL PCR SCREEN
MRSA, PCR: NEGATIVE
Staphylococcus aureus: NEGATIVE

## 2018-01-25 LAB — URINALYSIS, ROUTINE W REFLEX MICROSCOPIC
Bilirubin Urine: NEGATIVE
GLUCOSE, UA: NEGATIVE mg/dL
Hgb urine dipstick: NEGATIVE
Ketones, ur: NEGATIVE mg/dL
Leukocytes, UA: NEGATIVE
Nitrite: NEGATIVE
PROTEIN: NEGATIVE mg/dL
Specific Gravity, Urine: 1.027 (ref 1.005–1.030)
pH: 5 (ref 5.0–8.0)

## 2018-01-25 LAB — BASIC METABOLIC PANEL
Anion gap: 8 (ref 5–15)
BUN: 16 mg/dL (ref 6–20)
CALCIUM: 9.9 mg/dL (ref 8.9–10.3)
CO2: 25 mmol/L (ref 22–32)
Chloride: 112 mmol/L — ABNORMAL HIGH (ref 101–111)
Creatinine, Ser: 0.99 mg/dL (ref 0.44–1.00)
GFR calc Af Amer: >60 mL/min (ref >60)
Glucose, Bld: 73 mg/dL (ref 65–99)
Potassium: 3.7 mmol/L (ref 3.5–5.1)
SODIUM: 145 mmol/L (ref 135–145)

## 2018-01-25 LAB — CBC WITH DIFFERENTIAL/PLATELET
Abs Immature Granulocytes: 0 10*3/uL (ref 0.0–0.1)
BASOS ABS: 0 10*3/uL (ref 0.0–0.1)
Basophils Relative: 0 %
Eosinophils Absolute: 0.2 10*3/uL (ref 0.0–0.7)
Eosinophils Relative: 3 %
HEMATOCRIT: 38 % (ref 36.0–46.0)
Hemoglobin: 11.8 g/dL — ABNORMAL LOW (ref 12.0–15.0)
Immature Granulocytes: 0 %
LYMPHS ABS: 2.5 10*3/uL (ref 0.7–4.0)
Lymphocytes Relative: 41 %
MCH: 27 pg (ref 26.0–34.0)
MCHC: 31.1 g/dL (ref 30.0–36.0)
MCV: 87 fL (ref 78.0–100.0)
Monocytes Absolute: 0.4 10*3/uL (ref 0.1–1.0)
Monocytes Relative: 6 %
Neutro Abs: 3.1 10*3/uL (ref 1.7–7.7)
Neutrophils Relative %: 50 %
Platelets: 331 10*3/uL (ref 150–400)
RBC: 4.37 MIL/uL (ref 3.87–5.11)
RDW: 14.6 % (ref 11.5–15.5)
WBC: 6.2 10*3/uL (ref 4.0–10.5)

## 2018-01-25 LAB — PROTIME-INR
INR: 0.99
Prothrombin Time: 13 seconds (ref 11.4–15.2)

## 2018-01-25 LAB — HEMOGLOBIN A1C
Hgb A1c MFr Bld: 6.5 % — ABNORMAL HIGH (ref 4.8–5.6)
MEAN PLASMA GLUCOSE: 139.85 mg/dL

## 2018-01-25 LAB — APTT: APTT: 30 s (ref 24–36)

## 2018-01-25 LAB — GLUCOSE, CAPILLARY: GLUCOSE-CAPILLARY: 95 mg/dL (ref 65–99)

## 2018-02-05 DIAGNOSIS — M1711 Unilateral primary osteoarthritis, right knee: Secondary | ICD-10-CM | POA: Diagnosis present

## 2018-02-05 MED ORDER — TRANEXAMIC ACID 1000 MG/10ML IV SOLN
1000.0000 mg | INTRAVENOUS | Status: AC
  Administered 2018-02-06: 1000 mg via INTRAVENOUS
  Filled 2018-02-05: qty 1100

## 2018-02-05 MED ORDER — LACTATED RINGERS IV SOLN: INTRAVENOUS | Status: DC

## 2018-02-05 MED ORDER — TRANEXAMIC ACID 1000 MG/10ML IV SOLN
2000.0000 mg | INTRAVENOUS | Status: AC
  Administered 2018-02-06: 2000 mg via TOPICAL
  Filled 2018-02-05: qty 20

## 2018-02-05 MED ORDER — CEFAZOLIN SODIUM-DEXTROSE 2-4 GM/100ML-% IV SOLN
2.0000 g | INTRAVENOUS | Status: AC
  Administered 2018-02-06: 2 g via INTRAVENOUS
  Filled 2018-02-05: qty 100

## 2018-02-05 MED ORDER — BUPIVACAINE LIPOSOME 1.3 % IJ SUSP
20.0000 mL | INTRAMUSCULAR | Status: AC
  Administered 2018-02-06: 20 mL
  Filled 2018-02-05: qty 20

## 2018-02-05 NOTE — H&P (Signed)
TOTAL KNEE ADMISSION H&P  Patient is being admitted for right total knee arthroplasty.  Subjective:  Chief Complaint:right knee pain.  HPI: Nichole Schwartz, 57 y.o. female, has a history of pain and functional disability in the right knee due to arthritis and has failed non-surgical conservative treatments for greater than 12 weeks to includeNSAID's and/or analgesics, corticosteriod injections, flexibility and strengthening excercises, weight reduction as appropriate and activity modification.  Onset of symptoms was gradual, starting several years ago with gradually worsening course since that time. The patient noted no past surgery on the right knee(s).  Patient currently rates pain in the right knee(s) at 10 out of 10 with activity. Patient has night pain, worsening of pain with activity and weight bearing, pain that interferes with activities of daily living, pain with passive range of motion and crepitus.  Patient has evidence of periarticular osteophytes and joint space narrowing by imaging studies.  There is no active infection.  Patient Active Problem List   Diagnosis Date Noted  . Primary osteoarthritis of left knee 10/22/2017  . Degenerative arthritis of left knee 10/19/2017   Past Medical History:  Diagnosis Date  . Arthritis   . Diabetes mellitus without complication (HCC)   . Hypertension   . Hypothyroidism     Past Surgical History:  Procedure Laterality Date  . TOTAL KNEE ARTHROPLASTY Left 10/22/2017   Procedure: TOTAL KNEE ARTHROPLASTY;  Surgeon: Gean Birchwood, MD;  Location: [facility] OR;  Service: Orthopedics;  Laterality: Left;    No current facility-administered medications for this encounter.    Current Outpatient Medications  Medication Sig Dispense Refill Last Dose  . BIOTIN PO Take 1 tablet by mouth daily.   10/21/2017 at Unknown time  . ergocalciferol (VITAMIN D2) 50000 units capsule Take 50,000 Units by mouth every Monday.   week  . glimepiride (AMARYL) 4 MG tablet Take 4  mg by mouth daily with breakfast.   10/21/2017 at Unknown time  . levothyroxine (SYNTHROID, LEVOTHROID) 88 MCG tablet Take 88 mcg by mouth daily before breakfast.   10/22/2017 at Unknown time  . losartan (COZAAR) 50 MG tablet Take 50 mg by mouth daily.   10/21/2017 at Unknown time  . metFORMIN (GLUCOPHAGE) 500 MG tablet Take 500-1,000 mg by mouth See admin instructions. Take 500 mg by mouth in the morning and take 1000 mg by mouth in the evening   10/21/2017 at Unknown time  . simvastatin (ZOCOR) 40 MG tablet Take 40 mg by mouth 3 (three) times a week.   Past Week at Unknown time  . aspirin 325 MG tablet Take 325 mg by mouth daily.   2 WEEKS   No Known Allergies  Social History   Tobacco Use  . Smoking status: Former Smoker    Last attempt to quit: 01/13/2017    Years since quitting: 1.0  . Smokeless tobacco: Never Used  . Tobacco comment: social  smoker  Substance Use Topics  . Alcohol use: Yes    Comment: social    No family history on file.   Review of Systems  Constitutional: Negative.   HENT: Positive for nosebleeds.   Eyes: Negative.   Respiratory: Negative.   Cardiovascular: Negative.   Gastrointestinal: Positive for nausea.  Genitourinary:       Kidney stones  Musculoskeletal: Positive for joint pain.  Skin: Positive for rash.  Neurological: Negative.   Endo/Heme/Allergies: Bruises/bleeds easily.  Psychiatric/Behavioral: Positive for depression. The patient is nervous/anxious.     Objective:  Physical Exam  Constitutional:  She is oriented to person, place, and time. She appears well-developed and well-nourished.  HENT:  Head: Normocephalic and atraumatic.  Eyes: Pupils are equal, round, and reactive to light.  Neck: Normal range of motion. Neck supple.  Cardiovascular: Intact distal pulses.  Respiratory: Effort normal.  Musculoskeletal: She exhibits tenderness.  Surgical scar the left knee is well-healed range of motion is as dictated from 0-105.  Collateral  ligaments are stable.  Neurovascular intact.  The right knee has obvious varus deformity tender along the medial joint line.  One plus effusion, 1+ laxity to valgus stress.  Neurovascular intact distally  Neurological: She is alert and oriented to person, place, and time.  Skin: Skin is warm and dry.  Psychiatric: She has a normal mood and affect. Her behavior is normal. Judgment and thought content normal.    Vital signs in last 24 hours:    Labs:   Estimated body mass index is 33.27 kg/m as calculated from the following:   Height as of 01/25/18:  (1.651 m).   Weight as of 01/25/18: 90.7 kg (199 lb 14.4 oz).   Imaging Review Plain radiographs demonstrate bilateral knee osteoarthritis medial compartment bone-on-bone left worse than right with periarticular osteophyte formation.    Preoperative templating of the joint replacement has been completed, documented, and submitted to the Operating Room personnel in order to optimize intra-operative equipment management.   Anticipated LOS equal to or greater than 2 midnights due to - Age 75 and older with one or more of the following:  - Obesity  - Expected need for hospital services (PT, OT, Nursing) required for safe  discharge  - Active co-morbidities: Diabetes     Assessment/Plan:  End stage arthritis, right knee   The patient history, physical examination, clinical judgment of the provider and imaging studies are consistent with end stage degenerative joint disease of the right knee(s) and total knee arthroplasty is deemed medically necessary. The treatment options including medical management, injection therapy arthroscopy and arthroplasty were discussed at length. The risks and benefits of total knee arthroplasty were presented and reviewed. The risks due to aseptic loosening, infection, stiffness, patella tracking problems, thromboembolic complications and other imponderables were discussed. The patient acknowledged the  explanation, agreed to proceed with the plan and consent was signed. Patient is being admitted for inpatient treatment for surgery, pain control, PT, OT, prophylactic antibiotics, VTE prophylaxis, progressive ambulation and ADL's and discharge planning. The patient is planning to be discharged home with home health services.

## 2018-02-06 ENCOUNTER — Inpatient Hospital Stay (HOSPITAL_COMMUNITY)
Admission: RE | Admit: 2018-02-06 | Discharge: 2018-02-07 | DRG: 470 | Disposition: A | Discharge status: Home Health | Payer: Medicare HMO | Attending: Orthopedic Surgery | Admitting: Orthopedic Surgery

## 2018-02-06 ENCOUNTER — Encounter (HOSPITAL_COMMUNITY): Payer: Self-pay

## 2018-02-06 ENCOUNTER — Encounter (HOSPITAL_COMMUNITY)
Admission: RE | Disposition: A | Discharge status: Home Health | Payer: Self-pay | Source: Home / Self Care | Attending: Orthopedic Surgery

## 2018-02-06 ENCOUNTER — Other Ambulatory Visit: Payer: Self-pay

## 2018-02-06 ENCOUNTER — Inpatient Hospital Stay (HOSPITAL_COMMUNITY): Payer: Medicare HMO | Admitting: Anesthesiology

## 2018-02-06 DIAGNOSIS — Z7984 Long term (current) use of oral hypoglycemic drugs: Secondary | ICD-10-CM | POA: Diagnosis not present

## 2018-02-06 DIAGNOSIS — Z87891 Personal history of nicotine dependence: Secondary | ICD-10-CM | POA: Diagnosis not present

## 2018-02-06 DIAGNOSIS — E119 Type 2 diabetes mellitus without complications: Secondary | ICD-10-CM | POA: Diagnosis present

## 2018-02-06 DIAGNOSIS — Z6833 Body mass index (BMI) 33.0-33.9, adult: Secondary | ICD-10-CM

## 2018-02-06 DIAGNOSIS — D62 Acute posthemorrhagic anemia: Secondary | ICD-10-CM | POA: Diagnosis not present

## 2018-02-06 DIAGNOSIS — E669 Obesity, unspecified: Secondary | ICD-10-CM | POA: Diagnosis present

## 2018-02-06 DIAGNOSIS — Z7982 Long term (current) use of aspirin: Secondary | ICD-10-CM

## 2018-02-06 DIAGNOSIS — M1711 Unilateral primary osteoarthritis, right knee: Secondary | ICD-10-CM | POA: Diagnosis present

## 2018-02-06 DIAGNOSIS — I10 Essential (primary) hypertension: Secondary | ICD-10-CM | POA: Diagnosis present

## 2018-02-06 DIAGNOSIS — M25561 Pain in right knee: Secondary | ICD-10-CM | POA: Diagnosis present

## 2018-02-06 DIAGNOSIS — E7800 Pure hypercholesterolemia, unspecified: Secondary | ICD-10-CM | POA: Diagnosis present

## 2018-02-06 DIAGNOSIS — E039 Hypothyroidism, unspecified: Secondary | ICD-10-CM | POA: Diagnosis present

## 2018-02-06 DIAGNOSIS — Z79890 Hormone replacement therapy (postmenopausal): Secondary | ICD-10-CM

## 2018-02-06 DIAGNOSIS — Z96652 Presence of left artificial knee joint: Secondary | ICD-10-CM | POA: Diagnosis present

## 2018-02-06 DIAGNOSIS — E785 Hyperlipidemia, unspecified: Secondary | ICD-10-CM | POA: Diagnosis present

## 2018-02-06 DIAGNOSIS — Z79899 Other long term (current) drug therapy: Secondary | ICD-10-CM | POA: Diagnosis not present

## 2018-02-06 HISTORY — DX: Type 2 diabetes mellitus without complications: E11.9

## 2018-02-06 HISTORY — PX: TOTAL KNEE ARTHROPLASTY: SHX125

## 2018-02-06 HISTORY — DX: Pure hypercholesterolemia, unspecified: E78.00

## 2018-02-06 LAB — HEMOGLOBIN A1C
HEMOGLOBIN A1C: 6.5 % — AB (ref 4.8–5.6)
Mean Plasma Glucose: 139.85 mg/dL

## 2018-02-06 LAB — GLUCOSE, CAPILLARY
GLUCOSE-CAPILLARY: 108 mg/dL — AB (ref 65–99)
GLUCOSE-CAPILLARY: 80 mg/dL (ref 65–99)
Glucose-Capillary: 90 mg/dL (ref 65–99)
Glucose-Capillary: 355 mg/dL — ABNORMAL HIGH (ref 65–99)

## 2018-02-06 SURGERY — ARTHROPLASTY, KNEE, TOTAL
Anesthesia: Spinal | Site: Knee | Laterality: Right

## 2018-02-06 MED ORDER — LIDOCAINE HCL (CARDIAC) PF 100 MG/5ML IV SOSY
PREFILLED_SYRINGE | INTRAVENOUS | Status: DC | PRN
  Administered 2018-02-06: 50 mg via INTRATRACHEAL

## 2018-02-06 MED ORDER — TIZANIDINE HCL 2 MG PO TABS: 2.0000 mg | ORAL_TABLET | Freq: Four times a day (QID) | ORAL | 0 refills | Status: AC | PRN

## 2018-02-06 MED ORDER — BUPIVACAINE-EPINEPHRINE (PF) 0.25% -1:200000 IJ SOLN
INTRAMUSCULAR | Status: DC | PRN
  Administered 2018-02-06: 50 mL via PERINEURAL

## 2018-02-06 MED ORDER — ROPIVACAINE HCL 7.5 MG/ML IJ SOLN
INTRAMUSCULAR | Status: DC | PRN
  Administered 2018-02-06: 20 mL via PERINEURAL

## 2018-02-06 MED ORDER — LOSARTAN POTASSIUM 50 MG PO TABS
50.0000 mg | ORAL_TABLET | Freq: Every day | ORAL | Status: DC
  Filled 2018-02-06: qty 1

## 2018-02-06 MED ORDER — CELECOXIB 200 MG PO CAPS
200.0000 mg | ORAL_CAPSULE | Freq: Two times a day (BID) | ORAL | Status: DC
  Administered 2018-02-06 – 2018-02-07 (×2): 200 mg via ORAL
  Filled 2018-02-06 (×2): qty 1

## 2018-02-06 MED ORDER — METFORMIN HCL 500 MG PO TABS: 500.0000 mg | ORAL_TABLET | ORAL | Status: DC

## 2018-02-06 MED ORDER — ACETAMINOPHEN 325 MG PO TABS: 325.0000 mg | ORAL_TABLET | Freq: Four times a day (QID) | ORAL | Status: DC | PRN

## 2018-02-06 MED ORDER — PROPOFOL 10 MG/ML IV BOLUS
INTRAVENOUS | Status: DC | PRN
  Administered 2018-02-06: 10 mg via INTRAVENOUS
  Administered 2018-02-06: 20 mg via INTRAVENOUS

## 2018-02-06 MED ORDER — SODIUM CHLORIDE 0.9 % IJ SOLN
INTRAMUSCULAR | Status: DC | PRN
  Administered 2018-02-06: 50 mL

## 2018-02-06 MED ORDER — PHENOL 1.4 % MT LIQD: 1.0000 | OROMUCOSAL | Status: DC | PRN

## 2018-02-06 MED ORDER — DOCUSATE SODIUM 100 MG PO CAPS
100.0000 mg | ORAL_CAPSULE | Freq: Two times a day (BID) | ORAL | Status: DC
  Administered 2018-02-06 – 2018-02-07 (×2): 100 mg via ORAL
  Filled 2018-02-06 (×2): qty 1

## 2018-02-06 MED ORDER — PROPOFOL 500 MG/50ML IV EMUL
INTRAVENOUS | Status: DC | PRN
  Administered 2018-02-06: 50 ug/kg/min via INTRAVENOUS

## 2018-02-06 MED ORDER — ALUM & MAG HYDROXIDE-SIMETH 200-200-20 MG/5ML PO SUSP: 30.0000 mL | ORAL | Status: DC | PRN

## 2018-02-06 MED ORDER — POLYETHYLENE GLYCOL 3350 17 G PO PACK: 17.0000 g | PACK | Freq: Every day | ORAL | Status: DC | PRN

## 2018-02-06 MED ORDER — ASPIRIN EC 81 MG PO TBEC
81.0000 mg | DELAYED_RELEASE_TABLET | Freq: Every day | ORAL | Status: DC
  Administered 2018-02-07: 81 mg via ORAL
  Filled 2018-02-06: qty 1

## 2018-02-06 MED ORDER — METFORMIN HCL 500 MG PO TABS: 1000.0000 mg | ORAL_TABLET | Freq: Every day | ORAL | Status: DC

## 2018-02-06 MED ORDER — MIDAZOLAM HCL 2 MG/2ML IJ SOLN
INTRAMUSCULAR | Status: AC
  Filled 2018-02-06: qty 2

## 2018-02-06 MED ORDER — GABAPENTIN 300 MG PO CAPS
300.0000 mg | ORAL_CAPSULE | Freq: Three times a day (TID) | ORAL | Status: DC
  Administered 2018-02-06 – 2018-02-07 (×3): 300 mg via ORAL
  Filled 2018-02-06 (×3): qty 1

## 2018-02-06 MED ORDER — METFORMIN HCL 500 MG PO TABS
500.0000 mg | ORAL_TABLET | Freq: Every day | ORAL | Status: DC
  Administered 2018-02-07: 500 mg via ORAL
  Filled 2018-02-06: qty 1

## 2018-02-06 MED ORDER — METOCLOPRAMIDE HCL 5 MG PO TABS: 5.0000 mg | ORAL_TABLET | Freq: Three times a day (TID) | ORAL | Status: DC | PRN

## 2018-02-06 MED ORDER — METHOCARBAMOL 1000 MG/10ML IJ SOLN
500.0000 mg | Freq: Four times a day (QID) | INTRAVENOUS | Status: DC | PRN
  Filled 2018-02-06: qty 5

## 2018-02-06 MED ORDER — GLYCOPYRROLATE 0.2 MG/ML IJ SOLN
INTRAMUSCULAR | Status: DC | PRN
  Administered 2018-02-06: 0.2 mg via INTRAVENOUS

## 2018-02-06 MED ORDER — LIDOCAINE 2% (20 MG/ML) 5 ML SYRINGE
INTRAMUSCULAR | Status: AC
  Filled 2018-02-06: qty 5

## 2018-02-06 MED ORDER — MIDAZOLAM HCL 5 MG/5ML IJ SOLN
INTRAMUSCULAR | Status: DC | PRN
  Administered 2018-02-06 (×2): 2 mg via INTRAVENOUS

## 2018-02-06 MED ORDER — DIPHENHYDRAMINE HCL 12.5 MG/5ML PO ELIX: 12.5000 mg | ORAL_SOLUTION | ORAL | Status: DC | PRN

## 2018-02-06 MED ORDER — TRANEXAMIC ACID 1000 MG/10ML IV SOLN
1000.0000 mg | Freq: Once | INTRAVENOUS | Status: AC
  Administered 2018-02-06: 1000 mg via INTRAVENOUS
  Filled 2018-02-06: qty 10

## 2018-02-06 MED ORDER — OXYCODONE HCL 5 MG PO TABS
5.0000 mg | ORAL_TABLET | ORAL | Status: DC | PRN
  Administered 2018-02-06: 5 mg via ORAL
  Administered 2018-02-06 – 2018-02-07 (×4): 10 mg via ORAL
  Filled 2018-02-06 (×4): qty 2
  Filled 2018-02-06: qty 1

## 2018-02-06 MED ORDER — GLIMEPIRIDE 4 MG PO TABS
4.0000 mg | ORAL_TABLET | Freq: Every day | ORAL | Status: DC
  Administered 2018-02-07: 4 mg via ORAL
  Filled 2018-02-06: qty 1

## 2018-02-06 MED ORDER — BUPIVACAINE-EPINEPHRINE (PF) 0.25% -1:200000 IJ SOLN
INTRAMUSCULAR | Status: AC
  Filled 2018-02-06: qty 30

## 2018-02-06 MED ORDER — HYDROMORPHONE HCL 2 MG/ML IJ SOLN
0.5000 mg | INTRAMUSCULAR | Status: DC | PRN
  Administered 2018-02-06: 0.5 mg via INTRAVENOUS
  Filled 2018-02-06: qty 1

## 2018-02-06 MED ORDER — ONDANSETRON HCL 4 MG PO TABS: 4.0000 mg | ORAL_TABLET | Freq: Four times a day (QID) | ORAL | Status: DC | PRN

## 2018-02-06 MED ORDER — LEVOTHYROXINE SODIUM 88 MCG PO TABS
88.0000 ug | ORAL_TABLET | Freq: Every day | ORAL | Status: DC
  Administered 2018-02-07: 88 ug via ORAL
  Filled 2018-02-06: qty 1

## 2018-02-06 MED ORDER — MIDAZOLAM HCL 2 MG/2ML IJ SOLN
INTRAMUSCULAR | Status: AC
  Administered 2018-02-06: 2 mg via INTRAVENOUS
  Filled 2018-02-06: qty 2

## 2018-02-06 MED ORDER — BISACODYL 5 MG PO TBEC: 5.0000 mg | DELAYED_RELEASE_TABLET | Freq: Every day | ORAL | Status: DC | PRN

## 2018-02-06 MED ORDER — FENTANYL CITRATE (PF) 100 MCG/2ML IJ SOLN
INTRAMUSCULAR | Status: AC
  Administered 2018-02-06: 100 ug via INTRAVENOUS
  Filled 2018-02-06: qty 2

## 2018-02-06 MED ORDER — KCL IN DEXTROSE-NACL 20-5-0.45 MEQ/L-%-% IV SOLN
INTRAVENOUS | Status: DC
  Administered 2018-02-06: 19:00:00 via INTRAVENOUS
  Filled 2018-02-06: qty 1000

## 2018-02-06 MED ORDER — FLEET ENEMA 7-19 GM/118ML RE ENEM: 1.0000 | ENEMA | Freq: Once | RECTAL | Status: DC | PRN

## 2018-02-06 MED ORDER — PANTOPRAZOLE SODIUM 40 MG PO TBEC
40.0000 mg | DELAYED_RELEASE_TABLET | Freq: Every day | ORAL | Status: DC
  Administered 2018-02-06 – 2018-02-07 (×2): 40 mg via ORAL
  Filled 2018-02-06 (×2): qty 1

## 2018-02-06 MED ORDER — DEXAMETHASONE SODIUM PHOSPHATE 10 MG/ML IJ SOLN
INTRAMUSCULAR | Status: AC
  Filled 2018-02-06: qty 1

## 2018-02-06 MED ORDER — MIDAZOLAM HCL 2 MG/2ML IJ SOLN
2.0000 mg | Freq: Once | INTRAMUSCULAR | Status: AC
  Administered 2018-02-06: 2 mg via INTRAVENOUS

## 2018-02-06 MED ORDER — ASPIRIN EC 81 MG PO TBEC: 81.0000 mg | DELAYED_RELEASE_TABLET | Freq: Two times a day (BID) | ORAL | 0 refills | Status: AC

## 2018-02-06 MED ORDER — ONDANSETRON HCL 4 MG/2ML IJ SOLN: 4.0000 mg | Freq: Four times a day (QID) | INTRAMUSCULAR | Status: DC | PRN

## 2018-02-06 MED ORDER — ONDANSETRON HCL 4 MG/2ML IJ SOLN
INTRAMUSCULAR | Status: AC
  Filled 2018-02-06: qty 2

## 2018-02-06 MED ORDER — FENTANYL CITRATE (PF) 100 MCG/2ML IJ SOLN
100.0000 ug | Freq: Once | INTRAMUSCULAR | Status: AC
  Administered 2018-02-06: 100 ug via INTRAVENOUS

## 2018-02-06 MED ORDER — CHLORHEXIDINE GLUCONATE 4 % EX LIQD: 60.0000 mL | Freq: Once | CUTANEOUS | Status: DC

## 2018-02-06 MED ORDER — OXYCODONE-ACETAMINOPHEN 5-325 MG PO TABS: 1.0000 | ORAL_TABLET | ORAL | 0 refills | Status: AC | PRN

## 2018-02-06 MED ORDER — METHOCARBAMOL 500 MG PO TABS
500.0000 mg | ORAL_TABLET | Freq: Four times a day (QID) | ORAL | Status: DC | PRN
  Administered 2018-02-06 – 2018-02-07 (×2): 500 mg via ORAL
  Filled 2018-02-06 (×2): qty 1

## 2018-02-06 MED ORDER — LACTATED RINGERS IV SOLN
INTRAVENOUS | Status: DC
  Administered 2018-02-06: 11:00:00 via INTRAVENOUS

## 2018-02-06 MED ORDER — INSULIN ASPART 100 UNIT/ML ~~LOC~~ SOLN
0.0000 [IU] | Freq: Three times a day (TID) | SUBCUTANEOUS | Status: DC
  Administered 2018-02-07: 5 [IU] via SUBCUTANEOUS
  Administered 2018-02-07: 3 [IU] via SUBCUTANEOUS

## 2018-02-06 MED ORDER — METOCLOPRAMIDE HCL 5 MG/ML IJ SOLN: 5.0000 mg | Freq: Three times a day (TID) | INTRAMUSCULAR | Status: DC | PRN

## 2018-02-06 MED ORDER — SIMVASTATIN 40 MG PO TABS
40.0000 mg | ORAL_TABLET | ORAL | Status: DC
  Administered 2018-02-06: 40 mg via ORAL
  Filled 2018-02-06: qty 1

## 2018-02-06 MED ORDER — MENTHOL 3 MG MT LOZG: 1.0000 | LOZENGE | OROMUCOSAL | Status: DC | PRN

## 2018-02-06 MED ORDER — SODIUM CHLORIDE 0.9 % IR SOLN
Status: DC | PRN
  Administered 2018-02-06: 1000 mL
  Administered 2018-02-06: 3000 mL

## 2018-02-06 SURGICAL SUPPLY — 53 items
BANDAGE ESMARK 6X9 LF (GAUZE/BANDAGES/DRESSINGS) ×1 IMPLANT
BLADE SAG 18X100X1.27 (BLADE) ×3 IMPLANT
BLADE SAGITTAL 13X1.27X60 (BLADE) IMPLANT
BLADE SAGITTAL 13X1.27X60MM (BLADE)
BLADE SAW SGTL 13X75X1.27 (BLADE) IMPLANT
BNDG ELASTIC 6X10 VLCR STRL LF (GAUZE/BANDAGES/DRESSINGS) ×3 IMPLANT
BNDG ESMARK 6X9 LF (GAUZE/BANDAGES/DRESSINGS) ×3
BOWL SMART MIX CTS (DISPOSABLE) ×3 IMPLANT
CAPT KNEE TOTAL 3 ATTUNE ×3 IMPLANT
CEMENT HV SMART SET (Cement) ×6 IMPLANT
COVER SURGICAL LIGHT HANDLE (MISCELLANEOUS) ×3 IMPLANT
CUFF TOURNIQUET SINGLE 34IN LL (TOURNIQUET CUFF) ×3 IMPLANT
CUFF TOURNIQUET SINGLE 44IN (TOURNIQUET CUFF) IMPLANT
DRAPE EXTREMITY T 121X128X90 (DRAPE) ×3 IMPLANT
DRAPE U-SHAPE 47X51 STRL (DRAPES) ×3 IMPLANT
DRESSING AQUACEL AG SP 3.5X10 (GAUZE/BANDAGES/DRESSINGS) ×1 IMPLANT
DRSG AQUACEL AG ADV 3.5X10 (GAUZE/BANDAGES/DRESSINGS) IMPLANT
DRSG AQUACEL AG SP 3.5X10 (GAUZE/BANDAGES/DRESSINGS) ×3
DURAPREP 26ML APPLICATOR (WOUND CARE) ×3 IMPLANT
ELECT REM PT RETURN 9FT ADLT (ELECTROSURGICAL) ×3
ELECTRODE REM PT RTRN 9FT ADLT (ELECTROSURGICAL) ×1 IMPLANT
GLOVE BIO SURGEON STRL SZ7.5 (GLOVE) ×3 IMPLANT
GLOVE BIO SURGEON STRL SZ8.5 (GLOVE) ×3 IMPLANT
GLOVE BIOGEL PI IND STRL 8 (GLOVE) ×1 IMPLANT
GLOVE BIOGEL PI IND STRL 9 (GLOVE) ×1 IMPLANT
GLOVE BIOGEL PI INDICATOR 8 (GLOVE) ×2
GLOVE BIOGEL PI INDICATOR 9 (GLOVE) ×2
GOWN STRL REUS W/ TWL LRG LVL3 (GOWN DISPOSABLE) ×1 IMPLANT
GOWN STRL REUS W/ TWL XL LVL3 (GOWN DISPOSABLE) ×2 IMPLANT
GOWN STRL REUS W/TWL LRG LVL3 (GOWN DISPOSABLE) ×2
GOWN STRL REUS W/TWL XL LVL3 (GOWN DISPOSABLE) ×4
HANDPIECE INTERPULSE COAX TIP (DISPOSABLE) ×2
HOOD PEEL AWAY FACE SHEILD DIS (HOOD) ×6 IMPLANT
KIT BASIN OR (CUSTOM PROCEDURE TRAY) ×3 IMPLANT
KIT TURNOVER KIT B (KITS) ×3 IMPLANT
MANIFOLD NEPTUNE II (INSTRUMENTS) ×3 IMPLANT
NEEDLE 22X1 1/2 (OR ONLY) (NEEDLE) ×6 IMPLANT
NS IRRIG 1000ML POUR BTL (IV SOLUTION) ×3 IMPLANT
PACK TOTAL JOINT (CUSTOM PROCEDURE TRAY) ×3 IMPLANT
PAD ARMBOARD 7.5X6 YLW CONV (MISCELLANEOUS) ×6 IMPLANT
SET HNDPC FAN SPRY TIP SCT (DISPOSABLE) ×1 IMPLANT
SUT VIC AB 0 CT1 27 (SUTURE) ×2
SUT VIC AB 0 CT1 27XBRD ANBCTR (SUTURE) ×1 IMPLANT
SUT VIC AB 1 CTX 36 (SUTURE) ×2
SUT VIC AB 1 CTX36XBRD ANBCTR (SUTURE) ×1 IMPLANT
SUT VIC AB 2-0 CT1 27 (SUTURE) ×2
SUT VIC AB 2-0 CT1 TAPERPNT 27 (SUTURE) ×1 IMPLANT
SUT VIC AB 3-0 CT1 27 (SUTURE) ×2
SUT VIC AB 3-0 CT1 TAPERPNT 27 (SUTURE) ×1 IMPLANT
SYR CONTROL 10ML LL (SYRINGE) ×6 IMPLANT
TOWEL OR 17X24 6PK STRL BLUE (TOWEL DISPOSABLE) ×3 IMPLANT
TOWEL OR 17X26 10 PK STRL BLUE (TOWEL DISPOSABLE) ×3 IMPLANT
TRAY CATH 16FR W/PLASTIC CATH (SET/KITS/TRAYS/PACK) ×3 IMPLANT

## 2018-02-06 NOTE — Op Note (Signed)
PATIENT ID:      Nichole Schwartz  MRN:     [id number] DOB/AGE:    03/12/1961 / 57 y.o.       OPERATIVE REPORT    DATE OF PROCEDURE:  02/06/2018       PREOPERATIVE DIAGNOSIS:   RIGHT KNEE OSTEOARTHRITIS      Estimated body mass index is 33.12 kg/m as calculated from the following:   Height as of this encounter:  (1.651 m).   Weight as of this encounter: 199 lb (90.3 kg).                                                        POSTOPERATIVE DIAGNOSIS:   Right Knee Osteoarthritis                                                                       PROCEDURE:  Procedure(s): RIGHT TOTAL KNEE ARTHROPLASTY Using DepuyAttune RP implants #4L Femur, #4Tibia, 5 mm Attune RP bearing, 38 Patella     SURGEON: Nestor Lewandowsky    ASSISTANT:   Tomi Likens. [employer]   (Present and scrubbed throughout the case, critical for assistance with exposure, retraction, instrumentation, and closure.)         ANESTHESIA: Spinal, 20cc Exparel, 50cc 0.25% Marcaine  EBL: 300cc  FLUID REPLACEMENT: 1500cc crystalloid  TOURNIQUET TIME:  Drains: None  Tranexamic Acid: 1gm IV, 2gm topical  COMPLICATIONS:  None         INDICATIONS FOR PROCEDURE: The patient has  RIGHT KNEE OSTEOARTHRITIS, Var deformities, XR shows bone on bone arthritis, lateral subluxation of tibia. Patient has failed all conservative measures including anti-inflammatory medicines, narcotics, attempts at  exercise and weight loss, cortisone injections and viscosupplementation.  Risks and benefits of surgery have been discussed, questions answered.   DESCRIPTION OF PROCEDURE: The patient identified by armband, received  IV antibiotics, in the holding area at [facility]. Patient taken to the operating room, appropriate anesthetic  monitors were attached, and Spinal anesthesia was  induced. Tourniquet  applied high to the operative thigh. Lateral post and foot positioner  applied to the table, the lower extremity was then prepped and draped   in usual sterile fashion from the toes to the tourniquet. Time-out procedure was performed. We began the operation, with the knee flexed 120 degrees, by making the anterior midline incision starting at handbreadth above the patella going over the patella 1 cm medial to and 4 cm distal to the tibial tubercle. Small bleeders in the skin and the  subcutaneous tissue identified and cauterized. Transverse retinaculum was incised and reflected medially and a medial parapatellar arthrotomy was accomplished. the patella was everted and theprepatellar fat pad resected. The superficial medial collateral  ligament was then elevated from anterior to posterior along the proximal  flare of the tibia and anterior half of the menisci resected. The knee was hyperflexed exposing bone on bone arthritis. Peripheral and notch osteophytes as well as the cruciate ligaments were then resected. We continued to  work our way around posteriorly along the  proximal tibia, and externally  rotated the tibia subluxing it out from underneath the femur. A McHale  retractor was placed through the notch and a lateral Hohmann retractor  placed, and we then drilled through the proximal tibia in line with the  axis of the tibia followed by an intramedullary guide rod and 2-degree  posterior slope cutting guide. The tibial cutting guide, 3 degree posterior sloped, was pinned into place allowing resection of 0 mm of bone medially and 9 mm of bone laterally. Satisfied with the tibial resection, we then  entered the distal femur 2 mm anterior to the PCL origin with the  intramedullary guide rod and applied the distal femoral cutting guide  set at 9 mm, with 5 degrees of valgus. This was pinned along the  epicondylar axis. At this point, the distal femoral cut was accomplished without difficulty. We then sized for a #4L femoral component and pinned the guide in 3 degrees of external rotation. The chamfer cutting guide was pinned into place. The  anterior, posterior, and chamfer cuts were accomplished without difficulty followed by  the Attune RP box cutting guide and the box cut. We also removed posterior osteophytes from the posterior femoral condyles. At this  time, the knee was brought into full extension. We checked our  extension and flexion gaps and found them symmetric for a 5 mm bearing. Distracting in extension with a lamina spreader, the posterior horns of the menisci were removed, and Exparel, diluted to 60 cc, with 20cc NS, and 20cc 0.5% Marcaine,was injected into the capsule and synovium of the knee. The posterior patella cut was accomplished with the 9.5 mm Attune cutting guide, sized for a 38mm dome, and the fixation pegs drilled.The knee  was then once again hyperflexed exposing the proximal tibia. We sized for a # 4 tibial base plate, applied the smokestack and the conical reamer followed by the the Delta fin keel punch. We then hammered into place the Attune RP trial femoral component, drilled the lugs, inserted a  5 mm trial bearing, trial patellar button, and took the knee through range of motion from 0-130 degrees. No thumb pressure was required for patellar Tracking. All trial components were removed, mating surfaces irrigated with pulse lavage, and dried with suction and sponges. 10 cc of the Exparel solution was applied to the cancellus bone of the patella distal femur and proximal tibia.  After waiting 1 minute, the bony surfaces were again, dried with sponges. A double batch of DePuy HV cement with 1500 mg of Zinacef was mixed and applied to all bony metallic mating surfaces except for the posterior condyles of the femur itself. In order, we hammered into place the tibial tray and removed excess cement, the femoral component and removed excess cement. The final Attune RP bearing  was inserted, and the knee brought to full extension with compression.  The patellar button was clamped into place, and excess cement  removed.  While the cement cured the wound was irrigated out with normal saline solution pulse lavage. Ligament stability and patellar tracking were checked and found to be excellent. The parapatellar arthrotomy was closed with  running #1 Vicryl suture. The subcutaneous tissue with 0 and 2-0 undyed  Vicryl suture, and the skin with running 3-0 SQ vicryl. A dressing of Xeroform,  4 x 4, dressing sponges, Webril, and Ace wrap applied. The patient  awakened, and taken to recovery room without difficulty.   Nestor Lewandowsky 02/06/2018, 2:36 PM

## 2018-02-06 NOTE — Progress Notes (Signed)
Orthopedic Tech Progress Note Patient Details:  Nichole Schwartz 1961-02-21 [id number]  Ortho Devices Type of Ortho Device: Bone foam zero knee Ortho Device/Splint Interventions: Casandra Doffing 02/06/2018, 3:38 PM

## 2018-02-06 NOTE — Anesthesia Procedure Notes (Signed)
Anesthesia Regional Block: Adductor canal block   Pre-Anesthetic Checklist: ,, timeout performed, Correct Patient, Correct Site, Correct Laterality, Correct Procedure, Correct Position, site marked, Risks and benefits discussed,  Surgical consent,  Pre-op evaluation,  At surgeon's request and post-op pain management  Laterality: Right  Prep: chloraprep       Needles:  Injection technique: Single-shot  Needle Type: Echogenic Stimulator Needle     Needle Length: 9cm  Needle Gauge: 21   Needle insertion depth: 9 cm   Additional Needles:   Narrative:  Start time: 02/06/2018 11:39 AM End time: 02/06/2018 11:44 AM Injection made incrementally with aspirations every 5 mL.  Performed by: Personally  Anesthesiologist: Mal Amabile, MD  Additional Notes: Timeout performed. Patient sedated. Relevant anatomy ID'd using [place]. Incremental 2-28ml injection of LA with frequent aspiration. Patient tolerated procedure well.        Right Adductor Canal block

## 2018-02-06 NOTE — Anesthesia Preprocedure Evaluation (Signed)
Anesthesia Evaluation  Patient identified by MRN, date of birth, ID band Patient awake    Reviewed: Allergy & Precautions, NPO status , Patient's Chart, lab work & pertinent test results  Airway Mallampati: II  TM Distance: >3 FB Neck ROM: Full    Dental no notable dental hx. (+) Teeth Intact, Caps   Pulmonary former smoker,    Pulmonary exam normal breath sounds clear to auscultation       Cardiovascular hypertension, Pt. on medications negative cardio ROS Normal cardiovascular exam Rhythm:Regular Rate:Normal     Neuro/Psych negative neurological ROS  negative psych ROS   GI/Hepatic negative GI ROS, Neg liver ROS,   Endo/Other  diabetes, Well Controlled, Type 2, Oral Hypoglycemic AgentsHypothyroidism Hyperlipidemia Obesity  Renal/GU negative Renal ROS  negative genitourinary   Musculoskeletal  (+) Arthritis , Osteoarthritis,  DJD right knee   Abdominal (+) + obese,   Peds  Hematology negative hematology ROS (+)   Anesthesia Other Findings   Reproductive/Obstetrics                             Anesthesia Physical Anesthesia Plan  ASA: II  Anesthesia Plan: Spinal   Post-op Pain Management:  Regional for Post-op pain   Induction:   PONV Risk Score and Plan: 3 and Midazolam, Propofol infusion, Ondansetron and Treatment may vary due to age or medical condition  Airway Management Planned: Natural Airway and Simple Face Mask  Additional Equipment:   Intra-op Plan:   Post-operative Plan:   Informed Consent: I have reviewed the patients History and Physical, chart, labs and discussed the procedure including the risks, benefits and alternatives for the proposed anesthesia with the patient or authorized representative who has indicated his/her understanding and acceptance.   Dental advisory given  Plan Discussed with: CRNA and Surgeon  Anesthesia Plan Comments:          Anesthesia Quick Evaluation

## 2018-02-06 NOTE — Interval H&P Note (Signed)
History and Physical Interval Note:  02/06/2018 12:50 PM  Nichole Schwartz  has presented today for surgery, with the diagnosis of RIGHT KNEE OSTEOARTHRITIS  The various methods of treatment have been discussed with the patient and family. After consideration of risks, benefits and other options for treatment, the patient has consented to  Procedure(s): RIGHT TOTAL KNEE ARTHROPLASTY (Right) as a surgical intervention .  The patient's history has been reviewed, patient examined, no change in status, stable for surgery.  I have reviewed the patient's chart and labs.  Questions were answered to the patient's satisfaction.     Nestor Lewandowsky

## 2018-02-06 NOTE — Progress Notes (Signed)
1755 Received pt from PACU, A&O x4, RLE with compression wrap dry and intact. Pt denies pain at this time.

## 2018-02-06 NOTE — Discharge Instructions (Signed)

## 2018-02-06 NOTE — Transfer of Care (Signed)
Immediate Anesthesia Transfer of Care Note  Patient: Nichole Schwartz  Procedure(s) Performed: RIGHT TOTAL KNEE ARTHROPLASTY (Right Knee)  Patient Location: PACU  Anesthesia Type:Regional and Spinal  Level of Consciousness: awake, alert , oriented and patient cooperative  Airway & Oxygen Therapy: Patient Spontanous Breathing and Patient connected to nasal cannula oxygen  Post-op Assessment: Report given to RN and Post -op Vital signs reviewed and stable  Post vital signs: Reviewed and stable  Last Vitals:  Vitals Value Taken Time  BP 135/88 02/06/2018  3:20 PM  Temp 36.3 C 02/06/2018  3:20 PM  Pulse 75 02/06/2018  3:21 PM  Resp 17 02/06/2018  3:21 PM  SpO2 100 % 02/06/2018  3:21 PM  Vitals shown include unvalidated device data.  Last Pain:  Vitals:   02/06/18 1150  TempSrc:   PainSc: 0-No pain      Patients Stated Pain Goal: 3 (02/06/18 1101)  Complications: No apparent anesthesia complications

## 2018-02-07 ENCOUNTER — Encounter (HOSPITAL_COMMUNITY): Payer: Self-pay | Admitting: Orthopedic Surgery

## 2018-02-07 LAB — BASIC METABOLIC PANEL
Anion gap: 8 (ref 5–15)
BUN: 15 mg/dL (ref 6–20)
CHLORIDE: 107 mmol/L (ref 101–111)
CO2: 24 mmol/L (ref 22–32)
Calcium: 8.8 mg/dL — ABNORMAL LOW (ref 8.9–10.3)
Creatinine, Ser: 1.18 mg/dL — ABNORMAL HIGH (ref 0.44–1.00)
GFR calc Af Amer: 59 mL/min — ABNORMAL LOW (ref >60)
GFR calc non Af Amer: 51 mL/min — ABNORMAL LOW (ref >60)
GLUCOSE: 211 mg/dL — AB (ref 65–99)
POTASSIUM: 4.2 mmol/L (ref 3.5–5.1)
Sodium: 139 mmol/L (ref 135–145)

## 2018-02-07 LAB — GLUCOSE, CAPILLARY
GLUCOSE-CAPILLARY: 186 mg/dL — AB (ref 65–99)
Glucose-Capillary: 183 mg/dL — ABNORMAL HIGH (ref 65–99)
Glucose-Capillary: 205 mg/dL — ABNORMAL HIGH (ref 65–99)

## 2018-02-07 LAB — CBC
HCT: 32.6 % — ABNORMAL LOW (ref 36.0–46.0)
HEMOGLOBIN: 10.1 g/dL — AB (ref 12.0–15.0)
MCH: 27.2 pg (ref 26.0–34.0)
MCHC: 31 g/dL (ref 30.0–36.0)
MCV: 87.6 fL (ref 78.0–100.0)
PLATELETS: 283 10*3/uL (ref 150–400)
RBC: 3.72 MIL/uL — AB (ref 3.87–5.11)
RDW: 14.5 % (ref 11.5–15.5)
WBC: 7.8 10*3/uL (ref 4.0–10.5)

## 2018-02-07 NOTE — Progress Notes (Signed)
Pt wants to go home this afternoon, cleared by PT. She has a walker and a 3 in 1 at home. Eric PA notified. Discharge orders were placed. Discharge instructions given to the pt. Discharged to home accompanied by family.

## 2018-02-07 NOTE — Discharge Summary (Signed)
Patient ID: Nichole Schwartz MRN: [id number] DOB/AGE: 1961-09-07 57 y.o.  Admit date: 02/06/2018 Discharge date: 02/07/2018  Admission Diagnoses:  Principal Problem:   Osteoarthritis of right knee Active Problems:   Primary osteoarthritis of right knee   Discharge Diagnoses:  Same  Past Medical History:  Diagnosis Date  . Arthritis    "knees; sometimes in my fingers and shoulders" (02/06/2018)  . High cholesterol   . Hypertension   . Hypothyroidism   . Type II diabetes mellitus (HCC)     Surgeries: Procedure(s): RIGHT TOTAL KNEE ARTHROPLASTY on 02/06/2018   Consultants:   Discharged Condition: Improved  Hospital Course: Nichole Schwartz is an 57 y.o. female who was admitted 02/06/2018 for operative treatment ofOsteoarthritis of right knee. Patient has severe unremitting pain that affects sleep, daily activities, and work/hobbies. After pre-op clearance the patient was taken to the operating room on 02/06/2018 and underwent  Procedure(s): RIGHT TOTAL KNEE ARTHROPLASTY.    Patient was given perioperative antibiotics:  Anti-infectives (From admission, onward)   Start     Dose/Rate Route Frequency Ordered Stop   02/06/18 1130  ceFAZolin (ANCEF) IVPB 2g/100 mL premix     2 g 200 mL/hr over 30 Minutes Intravenous To ShortStay Surgical 02/05/18 1154 02/06/18 1302       Patient was given sequential compression devices, early ambulation, and chemoprophylaxis to prevent DVT.  Patient benefited maximally from hospital stay and there were no complications.    Recent vital signs:  Patient Vitals for the past 24 hrs:  BP Temp Temp src Pulse Resp SpO2  02/07/18 1504 117/60 98.2 F (36.8 C) Oral 76 16 100 %  02/07/18 0454 (!) 100/52 - Oral 60 12 98 %  02/07/18 0032 137/72 98 F (36.7 C) Oral 70 14 96 %  02/06/18 1952 (!) 142/80 97.9 F (36.6 C) Oral 66 16 100 %  02/06/18 1757 125/81 97.7 F (36.5 C) Oral 62 14 100 %  02/06/18 1743 (!) 133/93 97.7 F (36.5 C) - 81 20 97 %  02/06/18  1735 138/83 - - 67 18 99 %  02/06/18 1720 131/88 - - 86 17 98 %  02/06/18 1705 134/87 - - 80 17 96 %  02/06/18 1650 (!) 130/95 - - 94 20 100 %  02/06/18 1635 134/90 - - 79 17 96 %  02/06/18 1620 133/78 - - 70 15 96 %  02/06/18 1605 (!) 133/93 - - 66 20 97 %  02/06/18 1550 139/79 - - 65 16 100 %  02/06/18 1535 135/84 - - 74 18 98 %     Recent laboratory studies:  Recent Labs    02/07/18 0339  WBC 7.8  HGB 10.1*  HCT 32.6*  PLT 283  NA 139  K 4.2  CL 107  CO2 24  BUN 15  CREATININE 1.18*  GLUCOSE 211*  CALCIUM 8.8*     Discharge Medications:   Allergies as of 02/07/2018   No Known Allergies     Medication List    STOP taking these medications   aspirin 325 MG tablet Replaced by:  aspirin EC 81 MG tablet     TAKE these medications   aspirin EC 81 MG tablet Take 1 tablet (81 mg total) by mouth 2 (two) times daily. Replaces:  aspirin 325 MG tablet   BIOTIN PO Take 1 tablet by mouth daily.   ergocalciferol 50000 units capsule Commonly known as:  VITAMIN D2 Take 50,000 Units by mouth every Monday.   glimepiride 4 MG tablet  Commonly known as:  AMARYL Take 4 mg by mouth daily with breakfast.   levothyroxine 88 MCG tablet Commonly known as:  SYNTHROID, LEVOTHROID Take 88 mcg by mouth daily before breakfast.   losartan 50 MG tablet Commonly known as:  COZAAR Take 50 mg by mouth daily.   metFORMIN 500 MG tablet Commonly known as:  GLUCOPHAGE Take 500-1,000 mg by mouth See admin instructions. Take 500 mg by mouth in the morning and take 1000 mg by mouth in the evening   oxyCODONE-acetaminophen 5-325 MG tablet Commonly known as:  PERCOCET/ROXICET Take 1 tablet by mouth every 4 (four) hours as needed for severe pain.   simvastatin 40 MG tablet Commonly known as:  ZOCOR Take 40 mg by mouth 3 (three) times a week.   tiZANidine 2 MG tablet Commonly known as:  ZANAFLEX Take 1 tablet (2 mg total) by mouth every 6 (six) hours as needed.             Durable Medical Equipment  (From admission, onward)        Start     Ordered   02/06/18 1802  DME Walker rolling  Once    Question:  Patient needs a walker to treat with the following condition  Answer:  Status post right knee replacement   02/06/18 1801   02/06/18 1802  DME 3 n 1  Once     02/06/18 1801       Discharge Care Instructions  (From admission, onward)        Start     Ordered   02/07/18 0000  Weight bearing as tolerated     02/07/18 1520      Diagnostic Studies: No results found.  Disposition: Discharge disposition: 01-Home or Self Care       Discharge Instructions    Call MD / Call 911   Complete by:  As directed    If you experience chest pain or shortness of breath, CALL 911 and be transported to the hospital emergency room.  If you develope a fever above 101 F, pus (white drainage) or increased drainage or redness at the wound, or calf pain, call your surgeon's office.   Constipation Prevention   Complete by:  As directed    Drink plenty of fluids.  Prune juice may be helpful.  You may use a stool softener, such as Colace (over the counter) 100 mg twice a day.  Use MiraLax (over the counter) for constipation as needed.   Diet - low sodium heart healthy   Complete by:  As directed    Driving restrictions   Complete by:  As directed    No driving for 2 weeks   Increase activity slowly as tolerated   Complete by:  As directed    Patient may shower   Complete by:  As directed    You may shower without a dressing once there is no drainage.  Do not wash over the wound.  If drainage remains, cover wound with plastic wrap and then shower.   Weight bearing as tolerated   Complete by:  As directed       Follow-up Information    Gean Birchwood, MD In 2 weeks.   Specialty:  Orthopedic Surgery Contact information: [address] [place] [address] [phone]            Signed: Dannielle Burn 02/07/2018, 3:21 PM

## 2018-02-07 NOTE — Care Management Note (Signed)
Case Management Note  Patient Details  Name: Nichole Schwartz MRN: [id number] Date of Birth: [birth year]  Subjective/Objective:   57 yr old female s/p right toal knee arthroplasty.                 Action/Plan: Case manager spoke with patient and her family concerning discharge plan and DME. Patient used [facility] with previous knee surgery and will do so now. She has her DME. Referral was called to [facility] in [place], [place], CM faxed demographics, OP note, Order and Face to Face to her at [phone]. Patient will have family support at discharge.   Expected Discharge Date:  02/07/18               Expected Discharge Plan:  Home w Home Health Services  In-House Referral:  NA  Discharge planning Services  CM Consult  Post Acute Care Choice:  Home Health Choice offered to:  Patient  DME Arranged:  N/A DME Agency:  NA  HH Arranged:  PT HH Agency:  [facility]  Status of Service:  Completed, signed off  If discussed at Long Length of Stay Meetings, dates discussed:    Additional Comments:  Durenda Guthrie, RN 02/07/2018, 3:36 PM

## 2018-02-07 NOTE — Anesthesia Postprocedure Evaluation (Signed)
Anesthesia Post Note  Patient: Nichole Schwartz  Procedure(s) Performed: RIGHT TOTAL KNEE ARTHROPLASTY (Right Knee)     Patient location during evaluation: PACU Anesthesia Type: Spinal Level of consciousness: oriented and awake and alert Pain management: pain level controlled Vital Signs Assessment: post-procedure vital signs reviewed and stable Respiratory status: spontaneous breathing, respiratory function stable and nonlabored ventilation Cardiovascular status: blood pressure returned to baseline and stable Postop Assessment: no headache, no backache, no apparent nausea or vomiting, patient able to bend at knees and spinal receding Anesthetic complications: no                  Wil Slape A.

## 2018-02-07 NOTE — Progress Notes (Signed)
PATIENT ID: Nichole Schwartz  MRN: [id number]  DOB/AGE:  August 17, 1961 / 57 y.o.  1 Day Post-Op Procedure(s) (LRB): RIGHT TOTAL KNEE ARTHROPLASTY (Right)    PROGRESS NOTE Subjective: Patient is alert, oriented, no Nausea, no Vomiting, yes passing gas. Taking PO well. Denies SOB, Chest or Calf Pain. Using Incentive Spirometer, PAS in place. Ambulate WBAT , Patient reports pain as 2/10 .    Objective: Vital signs in last 24 hours: Vitals:   02/06/18 1757 02/06/18 1952 02/07/18 0032 02/07/18 0454  BP: 125/81 (!) 142/80 137/72 (!) 100/52  Pulse: 62 66 70 60  Resp: Temp: 97.7 F (36.5 C) 97.9 F (36.6 C) 98 F (36.7 C)   TempSrc: Oral Oral Oral Oral  SpO2: 100% 100% 96% 98%  Weight:      Height:          Intake/Output from previous day: I/O last 3 completed shifts: In: 1250 [P.O.:240; I.V.:700; Other:100; IV Piggyback:210] Out: 1070 [Urine:1000; Blood:70]   Intake/Output this shift: No intake/output data recorded.   LABORATORY DATA: Recent Labs    02/06/18 1258 02/06/18 1522 02/06/18 2237 02/07/18 0339  WBC  --   --   --  7.8  HGB  --   --   --  10.1*  HCT  --   --   --  32.6*  PLT  --   --   --  283  NA  --   --   --  139  K  --   --   --  4.2  CL  --   --   --  107  CO2  --   --   --  24  BUN  --   --   --  15  CREATININE  --   --   --  1.18*  GLUCOSE  --   --   --  211*  GLUCAP 80 90 355*  --   CALCIUM  --   --   --  8.8*    Examination: Neurologically intact Neurovascular intact Sensation intact distally Intact pulses distally Dorsiflexion/Plantar flexion intact Incision: dressing C/D/I and no drainage No cellulitis present Compartment soft}  Assessment:   1 Day Post-Op Procedure(s) (LRB): RIGHT TOTAL KNEE ARTHROPLASTY (Right) ADDITIONAL DIAGNOSIS: Expected Acute Blood Loss Anemia, Diabetes and Hypertension Anticipated LOS equal to or greater than 2 midnights due to - Age 53 and older with one or more of the following:  - Obesity  - Expected  need for hospital services (PT, OT, Nursing) required for safe  discharge  - Anticipated need for postoperative skilled nursing care or inpatient rehab  - Active co-morbidities: Diabetes    Plan: PT/OT WBAT, AROM and PROM  DVT Prophylaxis:  SCDx72hrs, ASA 81 mg BID x 4 weeks DISCHARGE PLAN: Home DISCHARGE NEEDS: HHPT, Walker and 3-in-1 comode seat     Dannielle Burn 02/07/2018, 8:03 AM

## 2018-02-07 NOTE — Progress Notes (Signed)
PT Evaluation   Clinical assessment: Pt presented supine in bed with HOB elevated, awake and willing to participate in therapy session. Prior to admission, pt reported that she ambulated with use of SPC when outside of her home and was independent with ADLs. Pt will have 24/7 supervision/assistance upon d/c. Pt currently able to perform bed mobility with min A, transfers with min guard and ambulated in hallway with RW and min guard. Plan for stair training at next session if appropriate.  Pt would continue to benefit from skilled physical therapy services at this time while admitted and after d/c to address the below listed limitations in order to improve overall safety and independence with functional mobility.  [place], [place], [place] [phone]     02/07/18 0900  PT Visit Information  Last PT Received On 02/07/18  Assistance Needed +1  History of Present Illness Pt is a 57 y/o female s/p R TKA. PMH including but not limited to DM and HTN.  Precautions  Precautions Fall;Knee  Precaution Comments Reviewed positioning of LE following TKA sx with pt and pt's family  Restrictions  Weight Bearing Restrictions Yes  RLE Weight Bearing WBAT  Home Living  Family/patient expects to be discharged to: Private residence  Living Arrangements Spouse/significant other;Children  Available Help at Discharge Family;Available 24 hours/day  Type of Home House  Home Access Stairs to enter  Entrance Stairs-Number of Steps 13  Entrance Stairs-Rails Left;Right  Home Layout Two level;Able to live on main level with bedroom/bathroom  Administrator, [employer] - single point;Shower seat;Walker - 2 wheels;BSC  Prior Function  Level of Independence Independent with assistive device(s)  Comments ambulates with SPC when outside of the house  Communication  Communication No difficulties  Pain Assessment  Pain Assessment Faces  Faces Pain Scale 2  Pain Location R knee  Pain  Descriptors / Indicators Sore  Pain Intervention(s) Monitored during session;Repositioned;Patient requesting pain meds-RN notified;RN gave pain meds during session  Cognition  Arousal/Alertness Awake/alert  Behavior During Therapy [facility] for tasks assessed/performed  Overall Cognitive Status Within Functional Limits for tasks assessed  Upper Extremity Assessment  Upper Extremity Assessment Overall WFL for tasks assessed  Lower Extremity Assessment  Lower Extremity Assessment RLE deficits/detail  RLE Deficits / Details pt with decreased strength and ROM limitations secondary to post-op pain. Sensation grossly intact.  Bed Mobility  Overal bed mobility Needs Assistance  Bed Mobility Supine to Sit  Supine to sit Min assist  General bed mobility comments increased time and effort, min A for R LE movement  Transfers  Overall transfer level Needs assistance  Equipment used Rolling walker (2 wheeled)  Transfers Sit to/from Stand  Sit to Stand Min guard  General transfer comment cueing for safe hand placement, min guard for safety  Ambulation/Gait  Ambulation/Gait assistance Min guard  Ambulation Distance (Feet) 150 Feet  Assistive device Rolling walker (2 wheeled)  Gait Pattern/deviations Step-to pattern;Step-through pattern;Decreased step length - right;Decreased step length - left;Decreased stride length;Decreased weight shift to right  General Gait Details pt with progressively improving gait pattern with increased step length on L side with practice; no instability or LOB, min guard for safety  Gait velocity decreased  Gait velocity interpretation 1.31 - 2.62 ft/sec, indicative of limited community ambulator  Balance  Overall balance assessment Needs assistance  Sitting-balance support Feet supported  Sitting balance-Leahy Scale Good  Standing balance support During functional activity;Bilateral upper extremity supported  Standing balance-Leahy Scale Poor  Exercises  Exercises Total  Joint  Total Joint Exercises  Quad Sets AROM;Strengthening;Right;10 reps;Seated  Heel Slides AAROM;Right;10 reps;Seated  Straight Leg Raises AAROM;Right;10 reps;Seated  PT - End of Session  Equipment Utilized During Treatment Gait belt  Activity Tolerance Patient tolerated treatment well  Patient left in chair;with call bell/phone within reach;with family/visitor present  Nurse Communication Mobility status  PT Assessment  PT Recommendation/Assessment Patient needs continued PT services  PT Visit Diagnosis Other abnormalities of gait and mobility (R26.89);Pain  Pain - Right/Left Right  Pain - part of body Knee  PT Problem List Decreased strength;Decreased range of motion;Decreased activity tolerance;Decreased balance;Decreased mobility;Decreased coordination;Decreased safety awareness;Decreased knowledge of use of DME;Decreased knowledge of precautions;Pain  PT Plan  PT Frequency (ACUTE ONLY) 7X/week  PT Treatment/Interventions (ACUTE ONLY) DME instruction;Gait training;Stair training;Therapeutic activities;Functional mobility training;Balance training;Therapeutic exercise;Neuromuscular re-education;Patient/family education  AM-PAC PT "6 Clicks" Daily Activity Outcome Measure  Difficulty turning over in bed (including adjusting bedclothes, sheets and blankets)? 1  Difficulty moving from lying on back to sitting on the side of the bed?  1  Difficulty sitting down on and standing up from a chair with arms (e.g., wheelchair, bedside commode, etc,.)? 1  Help needed moving to and from a bed to chair (including a wheelchair)? 3  Help needed walking in hospital room? 3  Help needed climbing 3-5 steps with a railing?  3  6 Click Score 12  Mobility G Code  CL  PT Recommendation  Follow Up Recommendations Home health PT;Supervision/Assistance - 24 hour  PT equipment None recommended by PT  Individuals Consulted  Consulted and Agree with Results and Recommendations Patient;Family  member/caregiver  Family Member Consulted daughter, son  Acute Rehab PT Goals  Patient Stated Goal return home  PT Goal Formulation With patient/family  Time For Goal Achievement 02/21/18  Potential to Achieve Goals Good  PT Time Calculation  PT Start Time (ACUTE ONLY) 0905  PT Stop Time (ACUTE ONLY) 0934  PT Time Calculation (min) (ACUTE ONLY) 29 min  PT General Charges  $$ ACUTE PT VISIT 1 Visit  PT Evaluation  $PT Eval Moderate Complexity 1 Mod  PT Treatments  $Gait Training 8-22 mins

## 2018-02-07 NOTE — Progress Notes (Signed)
Physical Therapy Treatment Patient Details Name: Nichole Schwartz MRN: [id number] DOB: 1961-06-17 Today's Date: 02/07/2018    History of Present Illness Pt is a 57 y/o female s/p R TKA. PMH including but not limited to DM and HTN.    PT Comments    Pt making excellent progress with functional mobility and successfully completed stair training this session as well. Pt is ready to d/c home from a PT perspective.  Pt would continue to benefit from skilled physical therapy services at this time while admitted and after d/c to address the below listed limitations in order to improve overall safety and independence with functional mobility.    Follow Up Recommendations  Home health PT;Supervision/Assistance - 24 hour     Equipment Recommendations  None recommended by PT    Recommendations for Other Services       Precautions / Restrictions Precautions Precautions: Fall;Knee Precaution Comments: Reviewed positioning of LE following TKA sx with pt and pt's family Restrictions Weight Bearing Restrictions: Yes RLE Weight Bearing: Weight bearing as tolerated    Mobility  Bed Mobility Overal bed mobility: Needs Assistance Bed Mobility: Supine to Sit;Sit to Supine     Supine to sit: Min assist Sit to supine: Min assist   General bed mobility comments: increased time and effort, min A for R LE movement  Transfers Overall transfer level: Needs assistance Equipment used: Rolling walker (2 wheeled) Transfers: Sit to/from Stand Sit to Stand: Min guard         General transfer comment: cueing for safe hand placement, min guard for safety  Ambulation/Gait Ambulation/Gait assistance: Min guard Ambulation Distance (Feet): 200 Feet Assistive device: Rolling walker (2 wheeled) Gait Pattern/deviations: Step-through pattern;Decreased step length - right;Decreased step length - left;Decreased stride length;Decreased weight shift to right Gait velocity: decreased Gait velocity  interpretation: 1.31 - 2.62 ft/sec, indicative of limited community ambulator General Gait Details: pt with good reciprocal gait pattern with step lengths close to equal bilaterally, no LOB or need for physical assistance   Stairs Stairs: Yes Stairs assistance: Min guard Stair Management: One rail Right;Step to pattern;Forwards Number of Stairs: 10 General stair comments: no instability or LOB, cueing for technique   Wheelchair Mobility    Modified Rankin (Stroke Patients Only)       Balance Overall balance assessment: Needs assistance Sitting-balance support: Feet supported Sitting balance-Leahy Scale: Good     Standing balance support: During functional activity;Bilateral upper extremity supported Standing balance-Leahy Scale: Poor                              Cognition Arousal/Alertness: Awake/alert Behavior During Therapy: WFL for tasks assessed/performed Overall Cognitive Status: Within Functional Limits for tasks assessed                                        Exercises Total Joint Exercises Long Arc Quad: AROM;Strengthening;Right;10 reps;Seated Knee Flexion: AROM;Strengthening;Right;10 reps;Seated Goniometric ROM: Flexion = 85 degrees; Extension = lacking 10 degrees; measured in sitting    General Comments        Pertinent Vitals/Pain Pain Assessment: Faces Faces Pain Scale: Hurts a little bit Pain Location: R knee Pain Descriptors / Indicators: Sore Pain Intervention(s): Monitored during session;Repositioned    Home Living  Prior Function            PT Goals (current goals can now be found in the care plan section) Acute Rehab PT Goals PT Goal Formulation: With patient/family Time For Goal Achievement: 02/21/18 Potential to Achieve Goals: Good Progress towards PT goals: Progressing toward goals    Frequency    7X/week      PT Plan Current plan remains appropriate     Co-evaluation              AM-PAC PT "6 Clicks" Daily Activity  Outcome Measure  Difficulty turning over in bed (including adjusting bedclothes, sheets and blankets)?: Unable Difficulty moving from lying on back to sitting on the side of the bed? : Unable Difficulty sitting down on and standing up from a chair with arms (e.g., wheelchair, bedside commode, etc,.)?: Unable Help needed moving to and from a bed to chair (including a wheelchair)?: None Help needed walking in hospital room?: None Help needed climbing 3-5 steps with a railing? : A Little 6 Click Score: 14    End of Session Equipment Utilized During Treatment: Gait belt Activity Tolerance: Patient tolerated treatment well Patient left: in bed;with call bell/phone within reach;with family/visitor present Nurse Communication: Mobility status PT Visit Diagnosis: Other abnormalities of gait and mobility (R26.89);Pain Pain - Right/Left: Right Pain - part of body: Knee     Time: 1344-1401 PT Time Calculation (min) (ACUTE ONLY): 17 min  Charges:  $Gait Training: 8-22 mins                    G Codes:       [place], [place], [place] [phone]    Alessandra Bevels Aaima Gaddie 02/07/2018, 2:46 PM

## 2018-12-30 IMAGING — CR DG CHEST 2V
2 series · 2 of 2 positions shown · non-contrast
Comparison: None.

CLINICAL DATA: Preop for knee replacement surgery.

EXAM:
CHEST  2 VIEW

[w chest pa]
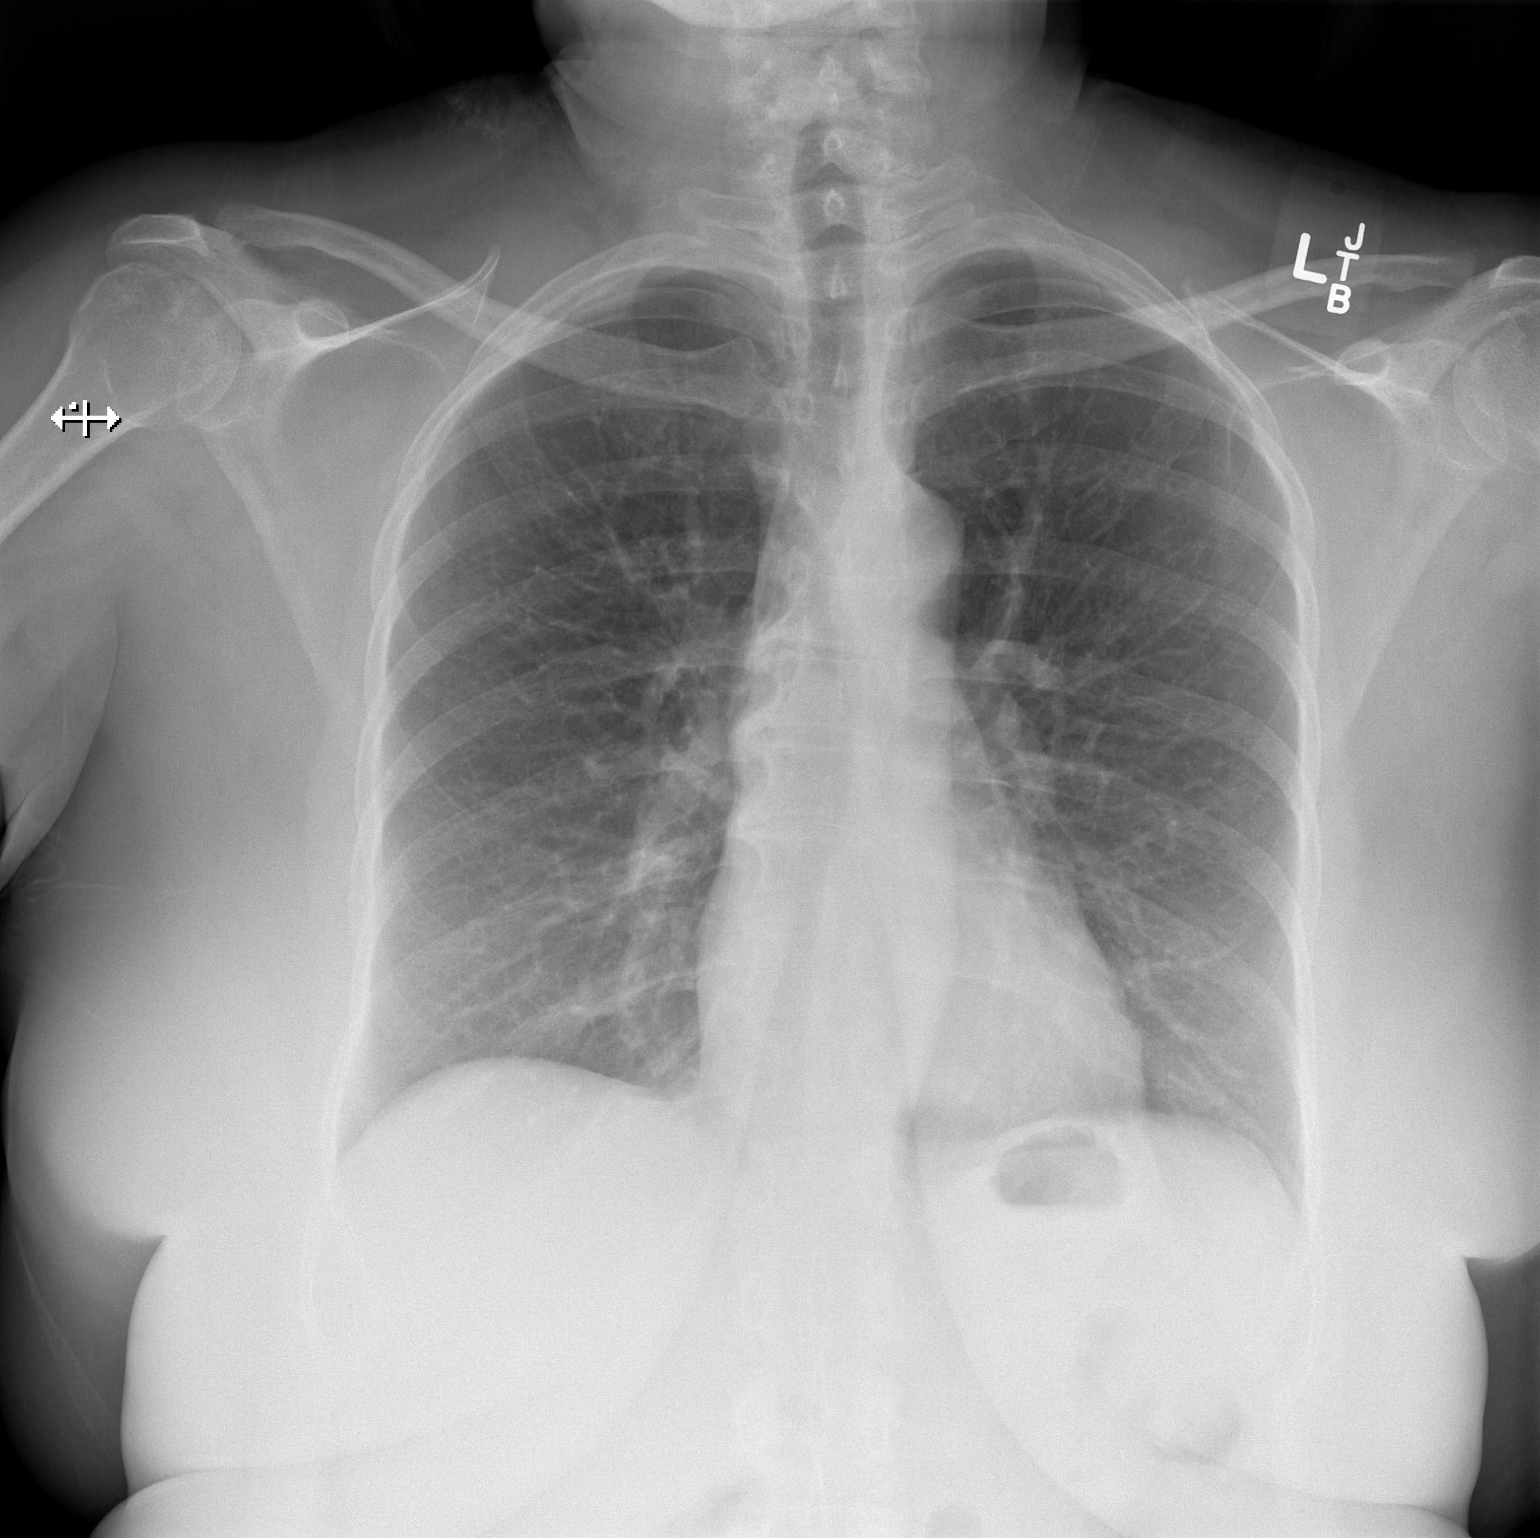

[w chest lat]
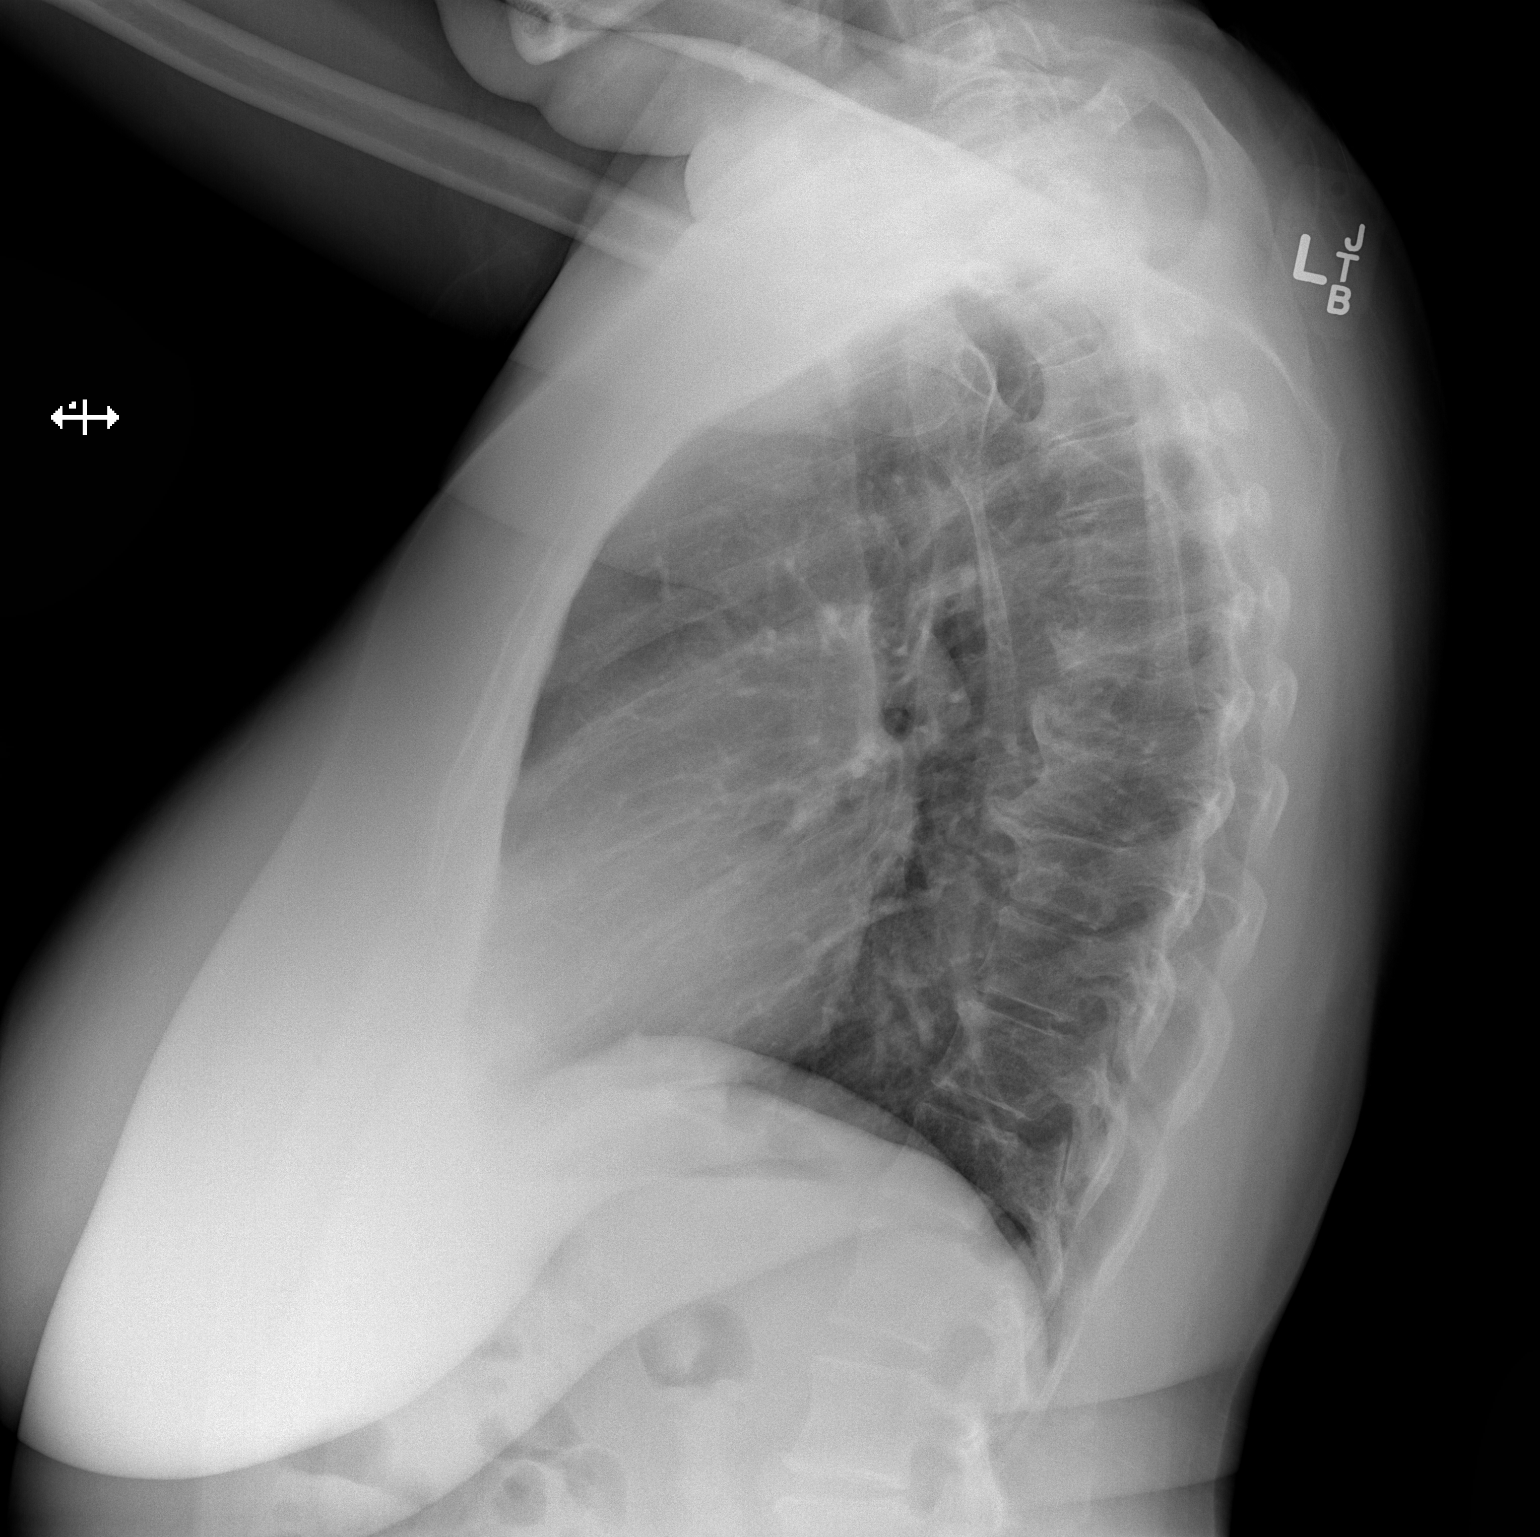

[2 of 2 positions shown; findings below may reference images not displayed]

FINDINGS: The heart size and mediastinal contours are within normal limits.
Both lungs are clear. The visualized skeletal structures are
unremarkable.
IMPRESSION: No active cardiopulmonary disease.
